# Patient Record
Sex: Female | Born: 1935 | Race: White | Hispanic: No | Smoking: Never smoker
Health system: Southern US, Community
[De-identification: ages and names within clinical notes are randomized; demographics above are authoritative.]

## PROBLEM LIST (undated history)

## (undated) DIAGNOSIS — IMO0002 Reserved for concepts with insufficient information to code with codable children: Secondary | ICD-10-CM

## (undated) DIAGNOSIS — D649 Anemia, unspecified: Secondary | ICD-10-CM

## (undated) DIAGNOSIS — I639 Cerebral infarction, unspecified: Secondary | ICD-10-CM

## (undated) DIAGNOSIS — K219 Gastro-esophageal reflux disease without esophagitis: Secondary | ICD-10-CM

## (undated) DIAGNOSIS — F32A Depression, unspecified: Secondary | ICD-10-CM

## (undated) DIAGNOSIS — J90 Pleural effusion, not elsewhere classified: Secondary | ICD-10-CM

## (undated) DIAGNOSIS — C801 Malignant (primary) neoplasm, unspecified: Secondary | ICD-10-CM

## (undated) DIAGNOSIS — E871 Hypo-osmolality and hyponatremia: Secondary | ICD-10-CM

## (undated) DIAGNOSIS — M1990 Unspecified osteoarthritis, unspecified site: Secondary | ICD-10-CM

## (undated) DIAGNOSIS — M545 Low back pain, unspecified: Secondary | ICD-10-CM

## (undated) DIAGNOSIS — I5030 Unspecified diastolic (congestive) heart failure: Secondary | ICD-10-CM

## (undated) DIAGNOSIS — I4891 Unspecified atrial fibrillation: Secondary | ICD-10-CM

## (undated) DIAGNOSIS — G2581 Restless legs syndrome: Secondary | ICD-10-CM

## (undated) DIAGNOSIS — J449 Chronic obstructive pulmonary disease, unspecified: Secondary | ICD-10-CM

## (undated) DIAGNOSIS — I10 Essential (primary) hypertension: Secondary | ICD-10-CM

## (undated) DIAGNOSIS — F329 Major depressive disorder, single episode, unspecified: Secondary | ICD-10-CM

## (undated) DIAGNOSIS — R0600 Dyspnea, unspecified: Secondary | ICD-10-CM

## (undated) DIAGNOSIS — I509 Heart failure, unspecified: Secondary | ICD-10-CM

## (undated) DIAGNOSIS — I739 Peripheral vascular disease, unspecified: Secondary | ICD-10-CM

## (undated) DIAGNOSIS — F419 Anxiety disorder, unspecified: Secondary | ICD-10-CM

## (undated) DIAGNOSIS — I2510 Atherosclerotic heart disease of native coronary artery without angina pectoris: Secondary | ICD-10-CM

## (undated) HISTORY — DX: Gastro-esophageal reflux disease without esophagitis: K21.9

## (undated) HISTORY — DX: Atherosclerotic heart disease of native coronary artery without angina pectoris: I25.10

## (undated) HISTORY — PX: CYSTECTOMY: SUR359

## (undated) HISTORY — DX: Hypo-osmolality and hyponatremia: E87.1

## (undated) HISTORY — DX: Pleural effusion, not elsewhere classified: J90

## (undated) HISTORY — PX: CARPAL TUNNEL RELEASE: SHX101

## (undated) HISTORY — DX: Essential (primary) hypertension: I10

## (undated) HISTORY — DX: Anxiety disorder, unspecified: F41.9

## (undated) HISTORY — PX: APPENDECTOMY: SHX54

## (undated) HISTORY — DX: Heart failure, unspecified: I50.9

## (undated) HISTORY — DX: Major depressive disorder, single episode, unspecified: F32.9

## (undated) HISTORY — DX: Depression, unspecified: F32.A

## (undated) HISTORY — DX: Unspecified diastolic (congestive) heart failure: I50.30

## (undated) HISTORY — DX: Anemia, unspecified: D64.9

## (undated) HISTORY — DX: Unspecified atrial fibrillation: I48.91

## (undated) HISTORY — DX: Cerebral infarction, unspecified: I63.9

## (undated) HISTORY — DX: Dyspnea, unspecified: R06.00

## (undated) HISTORY — PX: ABDOMINAL HYSTERECTOMY: SHX81

## (undated) HISTORY — DX: Low back pain: M54.5

## (undated) HISTORY — DX: Restless legs syndrome: G25.81

## (undated) HISTORY — DX: Low back pain, unspecified: M54.50

## (undated) HISTORY — PX: CATARACT EXTRACTION: SUR2

## (undated) HISTORY — DX: Chronic obstructive pulmonary disease, unspecified: J44.9

---

## 2006-02-08 ENCOUNTER — Ambulatory Visit: Payer: Self-pay | Admitting: Cardiology

## 2006-02-09 ENCOUNTER — Inpatient Hospital Stay (HOSPITAL_BASED_OUTPATIENT_CLINIC_OR_DEPARTMENT_OTHER): Admission: RE | Admit: 2006-02-09 | Discharge: 2006-02-09 | Payer: Self-pay | Admitting: Internal Medicine

## 2006-02-09 ENCOUNTER — Ambulatory Visit: Payer: Self-pay | Admitting: Internal Medicine

## 2006-02-16 ENCOUNTER — Ambulatory Visit: Payer: Self-pay | Admitting: Cardiology

## 2006-02-23 ENCOUNTER — Ambulatory Visit: Payer: Self-pay | Admitting: Cardiology

## 2010-08-24 DIAGNOSIS — I639 Cerebral infarction, unspecified: Secondary | ICD-10-CM

## 2010-08-24 HISTORY — DX: Cerebral infarction, unspecified: I63.9

## 2011-01-09 NOTE — Cardiovascular Report (Signed)
NAMEPHOENICIA, [name]                    ACCOUNT NO.:  [id number]   MEDICAL RECORD NO.:  [id number]          PATIENT TYPE:  OIB   LOCATION:  NA                           FACILITY:  [facility]   PHYSICIAN:  Arvilla Meres, M.D. LHCDATE OF BIRTH:  February 08, 1936   DATE OF PROCEDURE:  02/09/2006  DATE OF DISCHARGE:                              CARDIAC CATHETERIZATION   PRIMARY CARE PHYSICIAN:  Dr. Reymundo Poll in [place].   CARDIOLOGIST:  Learta Codding, M.D., [facility].   PATIENT IDENTIFICATION:  [name] is a 75 year old woman with a history of  previous tobacco use, as well as hypertension and hyperlipidemia.  She  previously underwent cardiac catheterization in 1998 at [facility] after a false positive nuclear study.  This showed normal  coronary arteries with a normal ejection fraction.  She has recently been  having some exertional chest pain concerning for angina and has been  arranged for catheterization in the outpatient lab.   PROCEDURES PERFORMED:  1.  Selective coronary angiography.  2.  Left heart catheterization.  3.  Left ventriculogram.   DESCRIPTION OF PROCEDURE:  The risks and benefits of catheterization were  explained.  Consent was signed and placed on the chart.  A 4-French arterial  sheath was placed in the right femoral artery.  However, there was some  oozing around the sheath, and we sized up to a 5-French arterial sheath with  good hemostasis.  Standard catheters, including preformed Judkins JL-4, 3-D  RCA, and angled pigtail were used for the procedure.  All catheter exchanges  made over wire.  There were no apparent complications.   Central aortic pressure is 135/69 with a mean of 93.  LV pressure is 152/90  with an LVEDP of 19.  There is no aortic stenosis.   Left main was normal.   LAD was a long vessel coursing to the apex.  It gave off 2 small proximal  diagonals and a large third diagonal, as well as a large septal perforator.  In the proximal  mid portion of the LAD, there was a tubular 30% lesion.  In  the mid-to-distal LAD, just after the takeoff of the third diagonal, there  was a focal 60% to 70% stenosis.  This appeared eccentric.  There was mild-  to-moderate, diffuse disease in the distal LAD which was small.   The left circumflex was a large system made up of a small OM-1 and a large  OM-2.  There is no angiographic CAD.   Right coronary was a large, dominant vessel.  It gave off an RV branch, a  large acute marginal which appeared to serve the PDA area, and three  posterolaterals.  There is no angiographic CAD.   A left ventriculogram done in the RAO position showed an EF of 70% with  evidence of left ventricular hypertrophy.  There was no mitral regurgitation  or wall motion abnormalities.   ASSESSMENT:  1.  Primarily moderate nonobstructive coronary artery disease, primarily      limited to the left anterior descending.  There is a  borderline lesion      in the mid-to-distal left anterior descending coronary artery, at the      bifurcation of the third diagonal.  2.  Normal left ventricular function with left ventricular hypertrophy.  3.  Mildly elevated end-diastolic pressure, consistent with diastolic      dysfunction.   PLAN / DISCUSSION:  I have reviewed the films with Dr. Juanda Chance.  I suspect  the LAD lesion may look somewhat worse just due to the fact that it is at  the bifurcation.  I am not sure if this is clearly hemodynamically  significant.  I think it is reasonable to proceed with a treadmill Myoview  to further evaluate the functional significance of this.  However, she has  had a false positive Myoview in the past, and this lesion could also be  mimicked by shifting breast attenuation.  Thus, if there is any question  after her Myoview, it would be reasonable to perform a flow wire  interrogation of the LAD.  For now, we will continue aggressive risk factor  management.      Arvilla Meres,  M.D. [facility]  Electronically Signed     DB/MEDQ  D:  02/09/2006  T:  02/09/2006  Job:  [id number]   cc:   Dr. Howell Rucks, [place]   Learta Codding, M.D. [facility]  [address]  [address]  [place]  [place] [address]

## 2012-02-09 DIAGNOSIS — K254 Chronic or unspecified gastric ulcer with hemorrhage: Secondary | ICD-10-CM

## 2012-02-09 DIAGNOSIS — R0602 Shortness of breath: Secondary | ICD-10-CM

## 2012-02-10 ENCOUNTER — Encounter: Payer: Self-pay | Admitting: Cardiology

## 2012-02-10 DIAGNOSIS — I2510 Atherosclerotic heart disease of native coronary artery without angina pectoris: Secondary | ICD-10-CM

## 2012-02-12 ENCOUNTER — Encounter: Payer: Self-pay | Admitting: Cardiology

## 2012-02-12 ENCOUNTER — Encounter: Payer: Self-pay | Admitting: *Deleted

## 2012-03-16 ENCOUNTER — Encounter: Payer: Self-pay | Admitting: Cardiology

## 2012-03-17 ENCOUNTER — Ambulatory Visit (INDEPENDENT_AMBULATORY_CARE_PROVIDER_SITE_OTHER): Payer: Medicare Other | Admitting: Adult Health

## 2012-03-17 ENCOUNTER — Encounter: Payer: Self-pay | Admitting: Adult Health

## 2012-03-17 VITALS — BP 140/80 | HR 58 | Ht 64.0 in | Wt 201.0 lb

## 2012-03-17 DIAGNOSIS — I4891 Unspecified atrial fibrillation: Secondary | ICD-10-CM

## 2012-03-17 DIAGNOSIS — K922 Gastrointestinal hemorrhage, unspecified: Secondary | ICD-10-CM | POA: Insufficient documentation

## 2012-03-17 DIAGNOSIS — I2510 Atherosclerotic heart disease of native coronary artery without angina pectoris: Secondary | ICD-10-CM

## 2012-03-17 DIAGNOSIS — I10 Essential (primary) hypertension: Secondary | ICD-10-CM

## 2012-03-17 NOTE — Progress Notes (Signed)
HPI: Mrs. dosed is a 76 year old patient of Dr. Andee Lineman who saw her at [facility] hospital secondary to CHF and new onset atrial fibrillation of unknown duration. She was admitted with acute anemia secondary to bleeding ulcer. She has a history of diastolic dysfunction, chronic pain being followed by Dr. Laurian Brim with steroid injections into her back.   According to consult note the patient had a history of cardiac catheterization in 1998 at [facility] demonstrating normal coronary arteries and normal LV function. She had a second cardiac catheterization in 2007 secondary to chest pain which demonstrated moderate, nonobstructive CAD, primarily limited to the LAD, with a borderline mid/distal LAD lesion, at the bifurcation of the third diagonal branch. She was also found to have elevated LVEDP consistent with diastolic dysfunction.   Chads score during hospitalization was 3 (hypertension and CVA), however Coumadin was not started in the setting of GI bleed. She is being followed by Dr. Teena Dunk, GI specialist in [place]. He has her on iron replacement and PPI, and plans a followup EGD.     She is without complaint today. Pain control is very well managed. She denies racing heart or palpitations. She denies shortness of breath. She denies chest pain. Allergies  Allergen Reactions  . Benadryl (Diphenhydramine) Rash    Current Outpatient Prescriptions  Medication Sig Dispense Refill  . Calcium Carbonate-Vitamin D (CALCIUM-VITAMIN D) 500-200 MG-UNIT per tablet Take 1 tablet by mouth.      . diazepam (VALIUM) 5 MG tablet Take 5 mg by mouth 2 (two) times daily.      Marland Kitchen FLUoxetine (PROZAC) 20 MG tablet Take 20 mg by mouth daily.      Marland Kitchen HYDROcodone-acetaminophen (NORCO) 5-325 MG per tablet Take 1 tablet by mouth every 6 (six) hours as needed.      . metoprolol (LOPRESSOR) 100 MG tablet Take 100 mg by mouth daily.      . polyethylene glycol (MIRALAX / GLYCOLAX) packet Take 17 g by mouth daily.       . potassium chloride SA (K-DUR,KLOR-CON) 20 MEQ tablet Take 20 mEq by mouth 2 (two) times daily.      . pravastatin (PRAVACHOL) 40 MG tablet Take 40 mg by mouth daily.      Marland Kitchen rOPINIRole (REQUIP) 3 MG tablet Take 3 mg by mouth at bedtime.      . temazepam (RESTORIL) 15 MG capsule Take 15 mg by mouth at bedtime as needed.      . triamterene-hydrochlorothiazide (MAXZIDE-25) 37.5-25 MG per tablet Take 1 tablet by mouth daily.        Past Medical History  Diagnosis Date  . Atrial fibrillation   . Diastolic heart failure   . Depression   . Anxiety disorder   . GERD (gastroesophageal reflux disease)   . Low back pain   . Hypertension   . RLS (restless legs syndrome)   . CVA (cerebral infarction)   . CHF (congestive heart failure)   . Anemia   . Dyspnea   . Hyponatremia   . Pleural effusion   . Coronary artery disease   . COPD (chronic obstructive pulmonary disease)     Past Surgical History  Procedure Date  . Carpal tunnel release   . Cystectomy   . Cataract extraction   . Appendectomy     [id number] of systems complete and found to be negative unless listed above  PHYSICAL EXAM BP 140/80  Pulse 58  Ht 5\' 4"  (1.626 m)  Wt 201 lb (  91.173 kg)  BMI 34.50 kg/m2  General: Well developed, well nourished, in no acute distress Head: Eyes PERRLA, No xanthomas.   Normal cephalic and atramatic  Lungs: Clear bilaterally to auscultation and percussion. Heart: HRIR S1 S2, without MRG.  Pulses are 2+ & equal.            No carotid bruit. No JVD.  No abdominal bruits. No femoral bruits. Abdomen: Bowel sounds are positive, abdomen soft and non-tender without masses or                  Hernia's noted. Msk:  Back normal, normal gait. But uses walker for ambulation. Normal strength and tone for age. Extremities: No clubbing, cyanosis or edema.  DP +1 Neuro: Alert and oriented X 3. Psych:  Good affect, responds appropriately  EKG: Coarse atrial fib with rate of 58 bpm.   ASSESSMENT  AND PLAN

## 2012-03-17 NOTE — Assessment & Plan Note (Signed)
Patient's heart rate is well-controlled without symptoms of rapid heart rate or palpitations. She remains on metoprolol 100 mg daily. She is not on Coumadin secondary to GI bleed. She continues to follow with Dr. Teena [name] with planned followup EGD. Her chest port is 3 for hypertension and CVA. She has a moderate risk for thromboembolic CVA, however, will need to have clearance from GI before starting her on Coumadin. She states she has had recent labs per Dr. Teena [name] which were found to be within normal limits. She is on iron replacement therapy as well. We will discuss this further with Dr. Dietrich [name], but will hold off on using Coumadin until ulcer is healed and she has a low likelihood of recurrent GI bleeding. She will be seen by Dr. Andee [name] on followup for more discussion of same.

## 2012-03-17 NOTE — Assessment & Plan Note (Signed)
Moderate control. She has recently been started on Maxzide 37.5/25 mg. Continued labs and management per PCP/Dr. Andee Lineman.

## 2012-03-17 NOTE — Patient Instructions (Addendum)
Your physician wants you to follow-up in: 6 months with Dr. Andee Lineman. You will receive a reminder letter in the mail two months in advance. If you don't receive a letter, please call our office to schedule the follow-up appointment.

## 2012-03-17 NOTE — Assessment & Plan Note (Signed)
She is without complaint of chest pain or any cardiac symptoms. We will continue to follow her. She may need followup stress testing if she becomes symptomatic with known history of LAD disease. Continue beta blocker at this time.

## 2013-02-06 ENCOUNTER — Inpatient Hospital Stay (HOSPITAL_COMMUNITY)
Admission: EM | Admit: 2013-02-06 | Discharge: 2013-02-14 | DRG: 065 | Disposition: A | Discharge status: Skilled Nursing Facility | Payer: Medicare Other | Attending: Diagnostic Neuroimaging | Admitting: Diagnostic Neuroimaging

## 2013-02-06 ENCOUNTER — Inpatient Hospital Stay (HOSPITAL_COMMUNITY): Payer: Medicare Other

## 2013-02-06 ENCOUNTER — Encounter (HOSPITAL_COMMUNITY): Payer: Self-pay

## 2013-02-06 ENCOUNTER — Emergency Department (HOSPITAL_COMMUNITY): Payer: Medicare Other

## 2013-02-06 ENCOUNTER — Other Ambulatory Visit: Payer: Self-pay

## 2013-02-06 DIAGNOSIS — J4489 Other specified chronic obstructive pulmonary disease: Secondary | ICD-10-CM | POA: Diagnosis present

## 2013-02-06 DIAGNOSIS — D696 Thrombocytopenia, unspecified: Secondary | ICD-10-CM | POA: Diagnosis not present

## 2013-02-06 DIAGNOSIS — F329 Major depressive disorder, single episode, unspecified: Secondary | ICD-10-CM | POA: Diagnosis present

## 2013-02-06 DIAGNOSIS — R51 Headache: Secondary | ICD-10-CM | POA: Diagnosis not present

## 2013-02-06 DIAGNOSIS — R519 Headache, unspecified: Secondary | ICD-10-CM | POA: Diagnosis not present

## 2013-02-06 DIAGNOSIS — I10 Essential (primary) hypertension: Secondary | ICD-10-CM

## 2013-02-06 DIAGNOSIS — I4891 Unspecified atrial fibrillation: Secondary | ICD-10-CM

## 2013-02-06 DIAGNOSIS — I5030 Unspecified diastolic (congestive) heart failure: Secondary | ICD-10-CM | POA: Diagnosis present

## 2013-02-06 DIAGNOSIS — J449 Chronic obstructive pulmonary disease, unspecified: Secondary | ICD-10-CM | POA: Diagnosis present

## 2013-02-06 DIAGNOSIS — I619 Nontraumatic intracerebral hemorrhage, unspecified: Secondary | ICD-10-CM | POA: Diagnosis not present

## 2013-02-06 DIAGNOSIS — D72829 Elevated white blood cell count, unspecified: Secondary | ICD-10-CM | POA: Diagnosis present

## 2013-02-06 DIAGNOSIS — E876 Hypokalemia: Secondary | ICD-10-CM | POA: Diagnosis not present

## 2013-02-06 DIAGNOSIS — I615 Nontraumatic intracerebral hemorrhage, intraventricular: Secondary | ICD-10-CM | POA: Diagnosis not present

## 2013-02-06 DIAGNOSIS — G2581 Restless legs syndrome: Secondary | ICD-10-CM | POA: Diagnosis present

## 2013-02-06 DIAGNOSIS — K219 Gastro-esophageal reflux disease without esophagitis: Secondary | ICD-10-CM | POA: Diagnosis present

## 2013-02-06 DIAGNOSIS — Z8673 Personal history of transient ischemic attack (TIA), and cerebral infarction without residual deficits: Secondary | ICD-10-CM

## 2013-02-06 DIAGNOSIS — I509 Heart failure, unspecified: Secondary | ICD-10-CM | POA: Diagnosis present

## 2013-02-06 DIAGNOSIS — E669 Obesity, unspecified: Secondary | ICD-10-CM | POA: Diagnosis present

## 2013-02-06 DIAGNOSIS — F3289 Other specified depressive episodes: Secondary | ICD-10-CM | POA: Diagnosis present

## 2013-02-06 DIAGNOSIS — I63219 Cerebral infarction due to unspecified occlusion or stenosis of unspecified vertebral arteries: Principal | ICD-10-CM | POA: Diagnosis present

## 2013-02-06 DIAGNOSIS — F411 Generalized anxiety disorder: Secondary | ICD-10-CM | POA: Diagnosis present

## 2013-02-06 DIAGNOSIS — Z8711 Personal history of peptic ulcer disease: Secondary | ICD-10-CM

## 2013-02-06 DIAGNOSIS — Z6834 Body mass index (BMI) 34.0-34.9, adult: Secondary | ICD-10-CM

## 2013-02-06 DIAGNOSIS — I2510 Atherosclerotic heart disease of native coronary artery without angina pectoris: Secondary | ICD-10-CM | POA: Diagnosis present

## 2013-02-06 DIAGNOSIS — I63531 Cerebral infarction due to unspecified occlusion or stenosis of right posterior cerebral artery: Secondary | ICD-10-CM | POA: Diagnosis present

## 2013-02-06 HISTORY — DX: Reserved for concepts with insufficient information to code with codable children: IMO0002

## 2013-02-06 HISTORY — DX: Cerebral infarction, unspecified: I63.9

## 2013-02-06 LAB — DIFFERENTIAL
Eosinophils Absolute: 0 10*3/uL (ref 0.0–0.7)
Lymphocytes Relative: 10 % — ABNORMAL LOW (ref 12–46)
Lymphs Abs: 1.4 10*3/uL (ref 0.7–4.0)
Monocytes Relative: 5 % (ref 3–12)
Neutro Abs: 11 10*3/uL — ABNORMAL HIGH (ref 1.7–7.7)
Neutrophils Relative %: 85 % — ABNORMAL HIGH (ref 43–77)

## 2013-02-06 LAB — PROTIME-INR: Prothrombin Time: 13.7 seconds (ref 11.6–15.2)

## 2013-02-06 LAB — POCT I-STAT, CHEM 8
Calcium, Ion: 1.08 mmol/L — ABNORMAL LOW (ref 1.13–1.30)
Hemoglobin: 18 g/dL — ABNORMAL HIGH (ref 12.0–15.0)
Sodium: 137 mEq/L (ref 135–145)
TCO2: 32 mmol/L (ref 0–100)

## 2013-02-06 LAB — COMPREHENSIVE METABOLIC PANEL
Alkaline Phosphatase: 102 U/L (ref 39–117)
BUN: 13 mg/dL (ref 6–23)
CO2: 30 mEq/L (ref 19–32)
Chloride: 94 mEq/L — ABNORMAL LOW (ref 96–112)
GFR calc Af Amer: 66 mL/min — ABNORMAL LOW (ref >90)
Glucose, Bld: 166 mg/dL — ABNORMAL HIGH (ref 70–99)
Potassium: 3.4 mEq/L — ABNORMAL LOW (ref 3.5–5.1)
Total Bilirubin: 0.7 mg/dL (ref 0.3–1.2)

## 2013-02-06 LAB — CBC
Hemoglobin: 16.7 g/dL — ABNORMAL HIGH (ref 12.0–15.0)
MCH: 31.1 pg (ref 26.0–34.0)
MCHC: 35.2 g/dL (ref 30.0–36.0)
WBC: 13 10*3/uL — ABNORMAL HIGH (ref 4.0–10.5)

## 2013-02-06 LAB — MRSA PCR SCREENING: MRSA by PCR: NEGATIVE

## 2013-02-06 MED ORDER — ACETAMINOPHEN 325 MG PO TABS
650.0000 mg | ORAL_TABLET | ORAL | Status: DC | PRN
  Administered 2013-02-07 – 2013-02-11 (×4): 650 mg via ORAL
  Filled 2013-02-06 (×5): qty 2

## 2013-02-06 MED ORDER — ONDANSETRON HCL 4 MG/2ML IJ SOLN
4.0000 mg | Freq: Four times a day (QID) | INTRAMUSCULAR | Status: DC | PRN
  Administered 2013-02-06 – 2013-02-14 (×17): 4 mg via INTRAVENOUS
  Filled 2013-02-06 (×18): qty 2

## 2013-02-06 MED ORDER — LABETALOL HCL 5 MG/ML IV SOLN
INTRAVENOUS | Status: AC
  Administered 2013-02-06: 20 mg
  Filled 2013-02-06: qty 4

## 2013-02-06 MED ORDER — BIOTENE DRY MOUTH MT LIQD
15.0000 mL | Freq: Two times a day (BID) | OROMUCOSAL | Status: DC
  Administered 2013-02-06 – 2013-02-14 (×16): 15 mL via OROMUCOSAL

## 2013-02-06 MED ORDER — METOPROLOL SUCCINATE ER 100 MG PO TB24
100.0000 mg | ORAL_TABLET | Freq: Every day | ORAL | Status: DC
  Filled 2013-02-06: qty 1

## 2013-02-06 MED ORDER — ACETAMINOPHEN 650 MG RE SUPP
650.0000 mg | RECTAL | Status: DC | PRN
  Administered 2013-02-06: 650 mg via RECTAL
  Filled 2013-02-06: qty 1

## 2013-02-06 MED ORDER — PANTOPRAZOLE SODIUM 40 MG IV SOLR
40.0000 mg | Freq: Every day | INTRAVENOUS | Status: DC
  Administered 2013-02-06 – 2013-02-08 (×3): 40 mg via INTRAVENOUS
  Filled 2013-02-06 (×5): qty 40

## 2013-02-06 MED ORDER — HYDROMORPHONE HCL PF 1 MG/ML IJ SOLN
INTRAMUSCULAR | Status: AC
  Filled 2013-02-06: qty 1

## 2013-02-06 MED ORDER — HYDROMORPHONE HCL PF 1 MG/ML IJ SOLN
1.0000 mg | Freq: Once | INTRAMUSCULAR | Status: AC
  Administered 2013-02-06: 1 mg via INTRAVENOUS

## 2013-02-06 MED ORDER — TRAMADOL HCL 50 MG PO TABS
50.0000 mg | ORAL_TABLET | Freq: Four times a day (QID) | ORAL | Status: DC | PRN
  Administered 2013-02-06 – 2013-02-14 (×10): 50 mg via ORAL
  Filled 2013-02-06 (×10): qty 1

## 2013-02-06 MED ORDER — SODIUM CHLORIDE 0.9 % IV SOLN
INTRAVENOUS | Status: DC
  Administered 2013-02-06 – 2013-02-12 (×8): via INTRAVENOUS

## 2013-02-06 MED ORDER — HYDROMORPHONE HCL PF 2 MG/ML IJ SOLN
2.0000 mg | Freq: Once | INTRAMUSCULAR | Status: AC
  Administered 2013-02-06: 2 mg via INTRAVENOUS
  Filled 2013-02-06: qty 1

## 2013-02-06 MED ORDER — NICARDIPINE HCL IN NACL 20-0.86 MG/200ML-% IV SOLN
5.0000 mg/h | Freq: Once | INTRAVENOUS | Status: AC
  Administered 2013-02-06: 5 mg/h via INTRAVENOUS
  Filled 2013-02-06: qty 200

## 2013-02-06 MED ORDER — HYDROMORPHONE HCL PF 1 MG/ML IJ SOLN
1.0000 mg | INTRAMUSCULAR | Status: DC | PRN
  Administered 2013-02-06 – 2013-02-08 (×8): 1 mg via INTRAVENOUS
  Filled 2013-02-06 (×9): qty 1

## 2013-02-06 MED ORDER — NICARDIPINE HCL IN NACL 20-0.86 MG/200ML-% IV SOLN
5.0000 mg/h | INTRAVENOUS | Status: DC
  Administered 2013-02-06: 1 mg/h via INTRAVENOUS
  Administered 2013-02-06: 3 mg/h via INTRAVENOUS
  Administered 2013-02-07 – 2013-02-10 (×11): 5 mg/h via INTRAVENOUS
  Administered 2013-02-10 (×2): 7.5 mg/h via INTRAVENOUS
  Filled 2013-02-06 (×18): qty 200

## 2013-02-06 MED ORDER — LABETALOL HCL 5 MG/ML IV SOLN
10.0000 mg | INTRAVENOUS | Status: DC | PRN
  Administered 2013-02-10: 20 mg via INTRAVENOUS
  Administered 2013-02-11: 40 mg via INTRAVENOUS
  Administered 2013-02-11 (×3): 20 mg via INTRAVENOUS
  Administered 2013-02-11 – 2013-02-12 (×2): 40 mg via INTRAVENOUS
  Administered 2013-02-12: 20 mg via INTRAVENOUS
  Administered 2013-02-12: 40 mg via INTRAVENOUS
  Administered 2013-02-12 – 2013-02-14 (×6): 20 mg via INTRAVENOUS
  Filled 2013-02-06 (×11): qty 4
  Filled 2013-02-06 (×2): qty 8

## 2013-02-06 MED ORDER — SENNOSIDES-DOCUSATE SODIUM 8.6-50 MG PO TABS
1.0000 | ORAL_TABLET | Freq: Two times a day (BID) | ORAL | Status: DC
  Filled 2013-02-06 (×2): qty 1

## 2013-02-06 MED ORDER — TRIAMTERENE-HCTZ 37.5-25 MG PO TABS
1.0000 | ORAL_TABLET | Freq: Every day | ORAL | Status: DC
  Filled 2013-02-06: qty 1

## 2013-02-06 NOTE — ED Notes (Signed)
Checked patient blood sugar it was 147 notified RN of blood sugar 

## 2013-02-06 NOTE — ED Notes (Signed)
Dr. Roseanne Reno made aware of Cardene drip stopped. No further orders received. Also made aware pt passed Swallow Screen

## 2013-02-06 NOTE — ED Provider Notes (Signed)
History     CSN: [id number]  Arrival date & time 02/06/13  1478   First MD Initiated Contact with Patient 02/06/13 0636      No chief complaint on file.   (Consider location/radiation/quality/duration/timing/severity/associated sxs/prior treatment) HPI HX per PT and EMS, at her computer this am had onset of severe HA and left facial numbness.  She has associated N/V, she was awake with sudden onset of symptoms unable to say what time was, states a few hours ago.  She had her husband help her to the floor, denies any trouble with vision or gait.  She felt near syncopal but did not pass out. She has h/o prior stroke that affected her left side.  HA mod to severe.  Past Medical History  Diagnosis Date  . Atrial fibrillation   . Diastolic heart failure   . Depression   . Anxiety disorder   . GERD (gastroesophageal reflux disease)   . Low back pain   . Hypertension   . RLS (restless legs syndrome)   . CVA (cerebral infarction)   . CHF (congestive heart failure)   . Anemia   . Dyspnea   . Hyponatremia   . Pleural effusion   . Coronary artery disease   . COPD (chronic obstructive pulmonary disease)     Past Surgical History  Procedure Laterality Date  . Carpal tunnel release    . Cystectomy    . Cataract extraction    . Appendectomy      No family history on file.  History  Substance Use Topics  . Smoking status: Never Smoker   . Smokeless tobacco: Not on file  . Alcohol Use: Yes    OB History   Grav Para Term Preterm Abortions TAB SAB Ect Mult Living                  Review of Systems  Constitutional: Negative for fever and chills.  HENT: Negative for neck pain and neck stiffness.   Eyes: Negative for pain.  Respiratory: Negative for shortness of breath.   Cardiovascular: Negative for chest pain.  Gastrointestinal: Negative for abdominal pain.  Genitourinary: Negative for dysuria.  Musculoskeletal: Negative for back pain.  Skin: Negative for rash.   Neurological: Positive for numbness and headaches. Negative for seizures.  All other systems reviewed and are negative.    Allergies  Benadryl  Home Medications   Current Outpatient Rx  Name  Route  Sig  Dispense  Refill  . Calcium Carbonate-Vitamin D (CALCIUM-VITAMIN D) 500-200 MG-UNIT per tablet   Oral   Take 1 tablet by mouth.         . diazepam (VALIUM) 5 MG tablet   Oral   Take 5 mg by mouth 2 (two) times daily.         Marland Kitchen FLUoxetine (PROZAC) 20 MG tablet   Oral   Take 20 mg by mouth daily.         Marland Kitchen HYDROcodone-acetaminophen (NORCO) 5-325 MG per tablet   Oral   Take 1 tablet by mouth every 6 (six) hours as needed.         . metoprolol (LOPRESSOR) 100 MG tablet   Oral   Take 100 mg by mouth daily.         . polyethylene glycol (MIRALAX / GLYCOLAX) packet   Oral   Take 17 g by mouth daily.         . potassium chloride SA (K-DUR,KLOR-CON) 20 MEQ tablet  Oral   Take 20 mEq by mouth 2 (two) times daily.         . pravastatin (PRAVACHOL) 40 MG tablet   Oral   Take 40 mg by mouth daily.         Marland Kitchen rOPINIRole (REQUIP) 3 MG tablet   Oral   Take 3 mg by mouth at bedtime.         . temazepam (RESTORIL) 15 MG capsule   Oral   Take 15 mg by mouth at bedtime as needed.         . triamterene-hydrochlorothiazide (MAXZIDE-25) 37.5-25 MG per tablet   Oral   Take 1 tablet by mouth daily.           There were no vitals taken for this visit.  Physical Exam  Nursing note and vitals reviewed. Constitutional: She is oriented to person, place, and time. She appears well-developed and well-nourished.  HENT:  Head: Normocephalic and atraumatic.  Mouth/Throat: Oropharynx is clear and moist. No oropharyngeal exudate.  Eyes: EOM are normal. Pupils are equal, round, and reactive to light.  Neck: Normal range of motion. Neck supple. No tracheal deviation present.  Cardiovascular: Normal rate, normal heart sounds and intact distal pulses.    Pulmonary/Chest: Effort normal and breath sounds normal. No respiratory distress.  Abdominal: Soft. Bowel sounds are normal. She exhibits no distension. There is no tenderness.  Musculoskeletal: Normal range of motion. She exhibits no edema.  Neurological: She is alert and oriented to person, place, and time.  Speech clear, no facial droop, no pronator drift, equal grips/ biceps/ triceps, subj dec sensorium to light touch L face.  Skin: Skin is warm and dry.    ED Course  Procedures (including critical care time)   Results for orders placed during the hospital encounter of 02/06/13  MRSA PCR SCREENING      Result Value Range   MRSA by PCR NEGATIVE  NEGATIVE  CBC      Result Value Range   WBC 13.0 (*) 4.0 - 10.5 K/uL   RBC 5.37 (*) 3.87 - 5.11 MIL/uL   Hemoglobin 16.7 (*) 12.0 - 15.0 g/dL   HCT 09.8 (*) 11.9 - 14.7 %   MCV 88.5  78.0 - 100.0 fL   MCH 31.1  26.0 - 34.0 pg   MCHC 35.2  30.0 - 36.0 g/dL   RDW 82.9  56.2 - 13.0 %   Platelets 131 (*) 150 - 400 K/uL  PROTIME-INR      Result Value Range   Prothrombin Time 13.7  11.6 - 15.2 seconds   INR 1.06  0.00 - 1.49  APTT      Result Value Range   aPTT 21 (*) 24 - 37 seconds  TROPONIN I      Result Value Range   Troponin I <0.30  <0.30 ng/mL  DIFFERENTIAL      Result Value Range   Neutrophils Relative % 85 (*) 43 - 77 %   Neutro Abs 11.0 (*) 1.7 - 7.7 K/uL   Lymphocytes Relative 10 (*) 12 - 46 %   Lymphs Abs 1.4  0.7 - 4.0 K/uL   Monocytes Relative 5  3 - 12 %   Monocytes Absolute 0.6  0.1 - 1.0 K/uL   Eosinophils Relative 0  0 - 5 %   Eosinophils Absolute 0.0  0.0 - 0.7 K/uL   Basophils Relative 0  0 - 1 %   Basophils Absolute 0.0  0.0 - 0.1 K/uL  COMPREHENSIVE METABOLIC PANEL      Result Value Range   Sodium 136  135 - 145 mEq/L   Potassium 3.4 (*) 3.5 - 5.1 mEq/L   Chloride 94 (*) 96 - 112 mEq/L   CO2 30  19 - 32 mEq/L   Glucose, Bld 166 (*) 70 - 99 mg/dL   BUN 13  6 - 23 mg/dL   Creatinine, Ser 1.61  0.50 -  1.10 mg/dL   Calcium 9.7  8.4 - 09.6 mg/dL   Total Protein 8.2  6.0 - 8.3 g/dL   Albumin 4.1  3.5 - 5.2 g/dL   AST 24  0 - 37 U/L   ALT 17  0 - 35 U/L   Alkaline Phosphatase 102  39 - 117 U/L   Total Bilirubin 0.7  0.3 - 1.2 mg/dL   GFR calc non Af Amer 57 (*) >90 mL/min   GFR calc Af Amer 66 (*) >90 mL/min  POCT I-STAT, CHEM 8      Result Value Range   Sodium 137  135 - 145 mEq/L   Potassium 3.3 (*) 3.5 - 5.1 mEq/L   Chloride 96  96 - 112 mEq/L   BUN 13  6 - 23 mg/dL   Creatinine, Ser 0.45  0.50 - 1.10 mg/dL   Glucose, Bld 409 (*) 70 - 99 mg/dL   Calcium, Ion 8.11 (*) 1.13 - 1.30 mmol/L   TCO2 32  0 - 100 mmol/L   Hemoglobin 18.0 (*) 12.0 - 15.0 g/dL   HCT 91.4 (*) 78.2 - 95.6 %   Ct Head Wo Contrast  02/06/2013   *RADIOLOGY REPORT*  Clinical Data: Code stroke.  Left arm weakness.  Tingling and facial droop.  CT HEAD WITHOUT CONTRAST  Technique:  Contiguous axial images were obtained from the base of the skull through the vertex without contrast.  Comparison: No priors.  Findings: The posterior aspect of the right temporal lobe extending into the right occipital lobe there is a large intraparenchymal hemorrhage which measures approximately 7.7 x 2.4 cm.  This is surrounded by some low attenuation in the adjacent parenchyma, compatible with some surrounding vasogenic edema.  There is intraventricular blood within the posterior aspect of the right lateral ventricle.  No blood identified within the left lateral ventricle, third ventricle or fourth ventricle at this time.  No hydrocephalous.  There is some mild local mass effect, but no evidence of frank midline shift.  Mild cerebral and cerebellar atrophy.  Visualized paranasal sinuses and mastoids are well pneumatized.  No acute displaced skull fractures are noted.  IMPRESSION: 1.  Large cerebral hemorrhage centered in the right posterior temporal and occipital regions with surrounding vasogenic edema and a small amount of local mass effect.   There is intraventricular hemorrhage within the right lateral ventricle at this time.  No associated hydrocephalus. 2.  Mild cerebral and cerebellar atrophy.  These results were called by telephone on 02/06/2013 at 06:59 a.m. to Dr. Dierdre Highman, who verbally acknowledged these results.   Original Report Authenticated By: Trudie Reed, M.D.   Mr [facility] Wo Contrast  02/06/2013   *RADIOLOGY REPORT*  Clinical Data:  Headache with visual changes.  Left hemineglect. Left hemianopsia.  Decreased left facial sensation.  Stroke risk factors include hypertension, atrial fibrillation, heart failure, and prior cerebral infarction.  MRI HEAD WITHOUT CONTRAST MRA HEAD WITHOUT CONTRAST  Technique:  Multiplanar, multiecho pulse sequences of the brain and surrounding structures were obtained without intravenous contrast. Angiographic images of  the head were obtained using MRA technique without contrast.  Comparison:  MRI brain 12/02/2009.  CT head earlier today.  MRI HEAD  Findings:  There is a large hemorrhagic infarct involving much of the  posterior cerebral artery territory including the posterior and medial temporal lobe, medial occipital lobe,  and thalamus, all on the right.  Marked T2 shortening correlates with the CT appearance of acute intraparenchymal hemorrhage.  There is intraventricular extension of blood, greater on the right.  There is minimal 1-2 mm right-to-left midline shift.  The patient previously suffered a left PCA territory infarct affecting a similar distribution, which was also hemorrhagic. These changes were less severe than those observed today.  There is resultant atrophy on today's scan of the cortex and subcortical white matter in the left occipital lobe with compensatory enlargement of the left lateral ventricle.  Mild generalized atrophy is present.  Mild chronic microvascular ischemic change affects the periventricular and subcortical white matter.  Calvarium skull base are intact.  Normal  pituitary and cerebellar tonsils.  Upper cervical region unremarkable. Bilateral cataract extraction.  No sinus or mastoid disease. Absent flow void right vertebral artery.  Compared with the CT appearance earlier, when technique differences are considered, the hemorrhage is not clearly worse.  IMPRESSION: Large hemorrhagic right PCA territory infarct.  Intraventricular extension.  No significant midline shift.  Chronic left PCA territory infarct.  Mild atrophy and chronic microvascular ischemic change.  MRA HEAD  Findings: The right internal carotid artery  shows mild irregularity without flow limiting stenosis.  There is a 50% stenosis of the supraclinoid left internal carotid artery.  There is a shallow atheromatous outpouching from the supraclinoid right ICA, consistent with infundibulum, 2 mm in size.  The basilar artery is widely patent.  Right vertebral is occluded distally. This vessel was patent in 2011.  An acute thrombosis of this vessel is not excluded.  Left vertebral is dominant.  Distal left vertebral displays mild irregularity in the proximal V4 segment with a 50-75% stenosis (arrow) near the origin of the left PICA.  Severe disease A1 segment left anterior cerebral artery.  Mild nonstenotic irregularity right proximal A1 ACA.  Mild nonstenotic irregularity M1 segment  middle cerebral arteries. No visible MCA branch occlusion.  Focal occlusion ambient P2 segment right PCA could represent in situ thrombosis, an embolus, or extrinsic mass effect from the hemorrhagic infarct.  Left PCA mildly irregular but otherwise patent.  No definite cerebellar branch occlusion.  IMPRESSION: Focal occlusion ambient P2 segment right PCA of uncertain etiology.  Right vertebral occlusion.  This vessel was patent in 2011; it is unclear if this occurred acutely, contributing to the patient's acute infarction.  50-75% stenosis distal left vertebral.  Intracranial atherosclerotic disease elsewhere as described.   Original  Report Authenticated By: Davonna Belling, M.D.   Mr Brain Wo Contrast  02/06/2013   *RADIOLOGY REPORT*  Clinical Data:  Headache with visual changes.  Left hemineglect. Left hemianopsia.  Decreased left facial sensation.  Stroke risk factors include hypertension, atrial fibrillation, heart failure, and prior cerebral infarction.  MRI HEAD WITHOUT CONTRAST MRA HEAD WITHOUT CONTRAST  Technique:  Multiplanar, multiecho pulse sequences of the brain and surrounding structures were obtained without intravenous contrast. Angiographic images of the head were obtained using MRA technique without contrast.  Comparison:  MRI brain 12/02/2009.  CT head earlier today.  MRI HEAD  Findings:  There is a large hemorrhagic infarct involving much of the  posterior cerebral artery territory including the posterior  and medial temporal lobe, medial occipital lobe,  and thalamus, all on the right.  Marked T2 shortening correlates with the CT appearance of acute intraparenchymal hemorrhage.  There is intraventricular extension of blood, greater on the right.  There is minimal 1-2 mm right-to-left midline shift.  The patient previously suffered a left PCA territory infarct affecting a similar distribution, which was also hemorrhagic. These changes were less severe than those observed today.  There is resultant atrophy on today's scan of the cortex and subcortical white matter in the left occipital lobe with compensatory enlargement of the left lateral ventricle.  Mild generalized atrophy is present.  Mild chronic microvascular ischemic change affects the periventricular and subcortical white matter.  Calvarium skull base are intact.  Normal pituitary and cerebellar tonsils.  Upper cervical region unremarkable. Bilateral cataract extraction.  No sinus or mastoid disease. Absent flow void right vertebral artery.  Compared with the CT appearance earlier, when technique differences are considered, the hemorrhage is not clearly worse.   IMPRESSION: Large hemorrhagic right PCA territory infarct.  Intraventricular extension.  No significant midline shift.  Chronic left PCA territory infarct.  Mild atrophy and chronic microvascular ischemic change.  MRA HEAD  Findings: The right internal carotid artery  shows mild irregularity without flow limiting stenosis.  There is a 50% stenosis of the supraclinoid left internal carotid artery.  There is a shallow atheromatous outpouching from the supraclinoid right ICA, consistent with infundibulum, 2 mm in size.  The basilar artery is widely patent.  Right vertebral is occluded distally. This vessel was patent in 2011.  An acute thrombosis of this vessel is not excluded.  Left vertebral is dominant.  Distal left vertebral displays mild irregularity in the proximal V4 segment with a 50-75% stenosis (arrow) near the origin of the left PICA.  Severe disease A1 segment left anterior cerebral artery.  Mild nonstenotic irregularity right proximal A1 ACA.  Mild nonstenotic irregularity M1 segment  middle cerebral arteries. No visible MCA branch occlusion.  Focal occlusion ambient P2 segment right PCA could represent in situ thrombosis, an embolus, or extrinsic mass effect from the hemorrhagic infarct.  Left PCA mildly irregular but otherwise patent.  No definite cerebellar branch occlusion.  IMPRESSION: Focal occlusion ambient P2 segment right PCA of uncertain etiology.  Right vertebral occlusion.  This vessel was patent in 2011; it is unclear if this occurred acutely, contributing to the patient's acute infarction.  50-75% stenosis distal left vertebral.  Intracranial atherosclerotic disease elsewhere as described.   Original Report Authenticated By: Davonna Belling, M.D.       Date: 02/06/2013  Rate: 71  Rhythm: atrial fibrillation  QRS Axis: normal  Intervals: normal  ST/T Wave abnormalities: nonspecific ST changes  Conduction Disutrbances:none  Narrative Interpretation:   Old EKG Reviewed: none  available  CRITICAL CARE Performed by: Marguerite Barba Total critical care time: 40 Critical care time was exclusive of separately billable procedures and treating other patients. Critical care was necessary to treat or prevent imminent or life-threatening deterioration. Critical care was time spent personally by me on the following activities: development of treatment plan with patient and/or surrogate as well as nursing, discussions with consultants, evaluation of patient's response to treatment, examination of patient, obtaining history from patient or surrogate, ordering and performing treatments and interventions, ordering and review of laboratory studies, ordering and review of radiographic studies, pulse oximetry and re-evaluation of patient's condition.  Code Stroke called on arrival - stat CT brain shows ICH - DR Amada Jupiter bedside, IV  Cardene drip started for HTN and plan NEU admit  .6:59 AM d/w NSG - no surgical intervention at this time. Plan NEU admit. No change repeat exam.    MDM  Code Stroke ICH Cardene drip NSG consult - not surgical Serial neuro exams NEU admit        Sunnie Nielsen, MD 02/06/13 2330

## 2013-02-06 NOTE — Progress Notes (Signed)
Utilization  Review completed.    Chala Gul,RN,BSN   Care Management.    

## 2013-02-06 NOTE — ED Notes (Signed)
Pt returned from MRI. Will be going upstairs to 3100

## 2013-02-06 NOTE — ED Notes (Signed)
Dr. Kirkpatrick at the bedside.  

## 2013-02-06 NOTE — H&P (Signed)
Neurology History and Physical  CC: Numbness  History is obtained from:Patient  HPI: Tracey [name] is a 77 y.o. female who was in her normal state of health until 9pm last night. At that time, she noticed vision changes and headache. This persisted and then this morning, she complained of some numbness and therefore her husband called 911.    LKW: 9pm 06/15 tpa given: no, hemorrhage NIHSS: 6   Past Medical History  Diagnosis Date  . Atrial fibrillation   . Diastolic heart failure   . Depression   . Anxiety disorder   . GERD (gastroesophageal reflux disease)   . Low back pain   . Hypertension   . RLS (restless legs syndrome)   . CVA (cerebral infarction)   . CHF (congestive heart failure)   . Anemia   . Dyspnea   . Hyponatremia   . Pleural effusion   . Coronary artery disease   . COPD (chronic obstructive pulmonary disease)    Social History: Tob: denies smoking and drinking.   Exam: Current vital signs: SpO2 96% Vital signs in last 24 hours: SpO2:  [96 %] 96 % (06/16 0652)  General: in bed, NAD CV: irregular Mental Status: Patient is awake, alert, oriented to person, place, year, but not month Patient is able to give a clear and coherent history. She has no sign of aphasia. She does have a left hemi neglect.  Cranial Nerves: II: Visual Fields are notable for a left hemianopia. Pupils are equal, round, and reactive to light.   III,IV, VI: She has a right gaze preference, but is able to cross midline voluntarily to the left,  V: Facial sensation is decreased on left to pin.  VII: Facial movement is symmetric.  VIII: hearing is intact to voice X: Uvula elevates symmetrically XI: Shoulder shrug is symmetric. XII: tongue is midline without atrophy or fasciculations.  Motor: Tone is normal. Bulk is normal. 5/5 strength was present in all four extremities.  Sensory: Sensation is decreased on left to pin.  Deep Tendon Reflexes: 2+ and symmetric in the biceps and  patellae.  Cerebellar: FNF without clear ataxia Gait: Not assessed due to acute nature of evaluation and multiple medical monitors in ED setting.   I have reviewed labs in epic and the results pertinent to this consultation are: INR normal CBC - mild leukocytosis and mildly low platelets  I have reviewed the images obtained:CT head - right parietal lobar hemorrhage.   Impression: 77 yo F with afib not on anticoagulation with IPH and symptoms starting last night. I suspect that this could be a hemorrhagic infarct. The bleed does have some areas that appear heterogenous and therefore will repeat head CT in 6 hours. Also possible would be amyloid, avm.   Recommendations: 1) BP control < 160/90 2) No anticoagulants or antiplatelets.  3) MRI brain, MRA head to assess for etiology.  4) Repeat CT this afternoon at 1pm unless MRI can be done at about that time 5) SCDs for prophy 6) continue home BP meds, but with current hypertension, have started IV nicardipine drip.  7) admit to ICU  This patient is critically ill and at significant risk of neurological worsening, death and care requires constant monitoring of vital signs, hemodynamics,respiratory and cardiac monitoring, neurological assessment, discussion with family, other specialists and medical decision making of high complexity. I spent 50 minutes of neurocritical care time in the care of  this patient.  Ritta Slot, MD [facility] [phone]  If  7pm- 7am, please page neurology on call at [phone].  02/06/2013  7:36 AM

## 2013-02-06 NOTE — Code Documentation (Signed)
Patient started having headache and tingling at 2030 yesterday evening. Patient's headache worsened and started tingling at 0430. Code stroke called at [phone], patient arrived at 54, LKW at 2030, EDP exam at [phone], stroke team arrived at 0636, neurologist arrived [phone], patient arrived in CT at 51, phlebotomist arrived at [phone], CT read at 40. NIH 06

## 2013-02-06 NOTE — ED Notes (Signed)
Pt states her head is starting to ease off

## 2013-02-06 NOTE — ED Notes (Signed)
CT for 02/06/13 clicked off but scheduled for 1300

## 2013-02-06 NOTE — ED Notes (Signed)
Pt going to 3100 at this time. Alert and oriented to situation, person and situation

## 2013-02-06 NOTE — ED Notes (Addendum)
Per [facility], the patient's husband said that the patient was last seen normal at 2030 last night.  At 2100, she c/o a severe headache.  He also noted she had left-sided facial droop and difficulty moving her left arm.  The patient's husband called [facility] this morning.  On arrival the patient was confused and incontinent of urine.

## 2013-02-07 DIAGNOSIS — I4891 Unspecified atrial fibrillation: Secondary | ICD-10-CM

## 2013-02-07 DIAGNOSIS — I059 Rheumatic mitral valve disease, unspecified: Secondary | ICD-10-CM

## 2013-02-07 DIAGNOSIS — I6350 Cerebral infarction due to unspecified occlusion or stenosis of unspecified cerebral artery: Secondary | ICD-10-CM

## 2013-02-07 DIAGNOSIS — I619 Nontraumatic intracerebral hemorrhage, unspecified: Secondary | ICD-10-CM

## 2013-02-07 MED ORDER — LORAZEPAM 2 MG/ML IJ SOLN
0.5000 mg | Freq: Once | INTRAMUSCULAR | Status: AC
  Administered 2013-02-07: 0.5 mg via INTRAVENOUS

## 2013-02-07 MED ORDER — LORAZEPAM 2 MG/ML IJ SOLN
INTRAMUSCULAR | Status: AC
  Filled 2013-02-07: qty 1

## 2013-02-07 NOTE — Progress Notes (Signed)
VASCULAR LAB PRELIMINARY  PRELIMINARY  PRELIMINARY  PRELIMINARY  Carotid duplex  completed.    Preliminary report:  Bilateral:  Less than 39% ICA stenosis.  Vertebral artery flow is antegrade.      Tracey [name], RVT 02/07/2013, 11:38 AM

## 2013-02-07 NOTE — Progress Notes (Signed)
Physical Therapy Evaluation Patient Details Name: Tracey [name] MRN: [id number] DOB: [birth year] Today's Date: 02/07/2013 Time: 1308-6578 PT Time Calculation (min): 29 min  PT Assessment / Plan / Recommendation Clinical Impression  Pt s/p R PCA CVA with decr mobility secondary to poor endurance, poor balance and visual disturbance.  Will benefit from PT to address endurance and balance.  Recommend NHP for therapy.    PT Assessment  Patient needs continued PT services    Follow Up Recommendations  SNF;Supervision/Assistance - 24 hour                Equipment Recommendations  Other (comment) (TBA)         Frequency Min 3X/week    Precautions / Restrictions Precautions Precautions: Fall Restrictions Weight Bearing Restrictions: No   Pertinent Vitals/Pain 87-99 bpm in afib, 98 % O2 on 4L, 138/57, sitting 153/118, lying down after treatment 132/75.  HA pain in which she was medicated.        Mobility  Bed Mobility Bed Mobility: Rolling Right;Right Sidelying to Sit Rolling Right: 3: Mod assist Right Sidelying to Sit: 3: Mod assist;HOB elevated Details for Bed Mobility Assistance: cues and assist needed for technique and sequencing.  Sat EOB a few minutes and became shaky, c/o dizziness and nausea.  Assisted pt back to supine and got cool washcloth.  Nausea better once PT positioned in bed.  Will reattempt OOB tomorrow.   Transfers Transfers: Not assessed Ambulation/Gait Ambulation/Gait Assistance: Not tested (comment) Stairs: No Wheelchair Mobility Wheelchair Mobility: No Modified Rankin (Stroke Patients Only) Pre-Morbid Rankin Score: Moderate disability Modified Rankin: Moderately severe disability    Exercises General Exercises - Lower Extremity Long Arc Quad: AROM;Both;15 reps;Supine   PT Diagnosis: Generalized weakness  PT Problem List: Decreased activity tolerance;Decreased balance;Decreased mobility;Decreased knowledge of use of DME;Decreased safety  awareness;Decreased cognition;Decreased knowledge of precautions PT Treatment Interventions: DME instruction;Functional mobility training;Therapeutic activities;Therapeutic exercise;Balance training;Patient/family education;Gait training;Stair training   PT Goals Acute Rehab PT Goals PT Goal Formulation: With patient Time For Goal Achievement: 02/21/13 Potential to Achieve Goals: Good Pt will go Supine/Side to Sit: with supervision PT Goal: Supine/Side to Sit - Progress: Goal set today Pt will go Sit to Stand: with supervision;with upper extremity assist PT Goal: Sit to Stand - Progress: Goal set today Pt will Ambulate: 51 - 150 feet;with supervision;with least restrictive assistive device PT Goal: Ambulate - Progress: Goal set today Pt will Go Up / Down Stairs: 1-2 stairs;with least restrictive assistive device;with min assist PT Goal: Up/Down Stairs - Progress: Goal set today  Visit Information  Last PT Received On: 02/07/13 Assistance Needed: +2    Subjective Data  Subjective: "I feel so weak." Patient Stated Goal: to go home   Prior Functioning  Home Living Lives With: Significant other Available Help at Discharge: Family Type of Home: House Home Access: Stairs to enter Secretary/administrator of Steps: 2 Entrance Stairs-Rails: None Home Layout: One level Bathroom Shower/Tub: Engineer, manufacturing systems: Handicapped height Home Adaptive Equipment: Straight cane;Walker - rolling;Wheelchair - manual;Other (comment) (lift chair) Additional Comments: Pt cooked but boyfriend prpared meds and got groceries.  Prior Function Level of Independence: Independent with assistive device(s) (used RW in home) Able to Take Stairs?: Yes Driving: No Vocation: Retired Musician: No difficulties    Copywriter, advertising Arousal/Alertness: Lethargic Behavior During Therapy: Flat affect Overall Cognitive Status: Impaired/Different from baseline Area of Impairment:  Orientation;Memory;Following commands;Safety/judgement;Awareness;Problem solving Orientation Level: Disoriented to;Situation;Time Following Commands: Follows one step commands consistently Safety/Judgement: Decreased  awareness of deficits;Decreased awareness of safety Problem Solving: Difficulty sequencing;Requires verbal cues    Extremity/Trunk Assessment Right Lower Extremity Assessment RLE ROM/Strength/Tone: WFL for tasks assessed Left Lower Extremity Assessment LLE ROM/Strength/Tone: WFL for tasks assessed   Balance Balance Balance Assessed: Yes Static Sitting Balance Static Sitting - Balance Support: Bilateral upper extremity supported;Feet supported Static Sitting - Level of Assistance: 4: Min assist Static Sitting - Comment/# of Minutes: Sat EOB for 5 minutes with min assist.  Became shaky therefore assisted pt to supine.    End of Session PT - End of Session Equipment Utilized During Treatment: Gait belt;Oxygen Activity Tolerance: Patient limited by fatigue Patient left: in bed;with call bell/phone within reach;with family/visitor present Nurse Communication: Mobility status;Need for lift equipment       INGOLD,Rashaud Ybarbo 02/07/2013, 10:23 AM [facility] [phone] (pager)

## 2013-02-07 NOTE — Progress Notes (Signed)
Stroke Team Progress Note  HISTORY  Tracey [name] is a 77 y.o. female who was in her normal state of health until 9pm last night. At that time, she noticed vision changes and headache. This persisted and then this morning, she complained of some numbness and therefore her husband called 911.   Patient was not a TPA candidate secondary to ICH. She was admitted to the neuro ICU  3100 for further evaluation and treatment.  SUBJECTIVE No family is at bedside. The patient complains of a headache and nausea and vomiting. The left face and arm numbness has improved.  OBJECTIVE Most recent Vital Signs: Filed Vitals:   02/07/13 0100 02/07/13 0200 02/07/13 0300 02/07/13 0400  BP: 142/83 142/71 138/63 141/68  Pulse: 83 86 93 97  Temp:    99.5 F (37.5 C)  TempSrc:    Oral  Resp: 11 11 18 19   Height:      Weight:      SpO2: 98% 97% 97% 96%   CBG (last 3)  No results found for this basename: GLUCAP,  in the last 72 hours  IV Fluid Intake:   . sodium chloride 100 mL/hr at 02/07/13 0321  . niCARDipine 5 mg/hr (02/07/13 0600)    MEDICATIONS  . antiseptic oral rinse  15 mL Mouth Rinse BID  . pantoprazole (PROTONIX) IV  40 mg Intravenous QHS   PRN:  acetaminophen, acetaminophen, HYDROmorphone (DILAUDID) injection, labetalol, ondansetron (ZOFRAN) IV, traMADol  Diet:  NPO  Activity:  Bedrest DVT Prophylaxis:  SCD  CLINICALLY SIGNIFICANT STUDIES Basic Metabolic Panel:  Recent Labs Lab 02/06/13 0637 02/06/13 0706  NA 136 137  K 3.4* 3.3*  CL 94* 96  CO2 30  --   GLUCOSE 166* 163*  BUN 13 13  CREATININE 0.96 1.10  CALCIUM 9.7  --    Liver Function Tests:  Recent Labs Lab 02/06/13 0637  AST 24  ALT 17  ALKPHOS 102  BILITOT 0.7  PROT 8.2  ALBUMIN 4.1   CBC:  Recent Labs Lab 02/06/13 0637 02/06/13 0706  WBC 13.0*  --   NEUTROABS 11.0*  --   HGB 16.7* 18.0*  HCT 47.5* 53.0*  MCV 88.5  --   PLT 131*  --    Coagulation:  Recent Labs Lab 02/06/13 0637  LABPROT  13.7  INR 1.06   Cardiac Enzymes:  Recent Labs Lab 02/06/13 0637  TROPONINI <0.30   Urinalysis: No results found for this basename: COLORURINE, APPERANCEUR, LABSPEC, PHURINE, GLUCOSEU, HGBUR, BILIRUBINUR, KETONESUR, PROTEINUR, UROBILINOGEN, NITRITE, LEUKOCYTESUR,  in the last 168 hours Lipid Panel No results found for this basename: chol, trig, hdl, cholhdl, vldl, ldlcalc   HgbA1C  No results found for this basename: HGBA1C    Urine Drug Screen:   No results found for this basename: labopia, cocainscrnur, labbenz, amphetmu, thcu, labbarb    Alcohol Level: No results found for this basename: ETH,  in the last 168 hours  Ct Head Wo Contrast  02/06/2013   *RADIOLOGY REPORT*  Clinical Data: Code stroke.  Left arm weakness.  Tingling and facial droop.  CT HEAD WITHOUT CONTRAST  Technique:  Contiguous axial images were obtained from the base of the skull through the vertex without contrast.  Comparison: No priors.  Findings: The posterior aspect of the right temporal lobe extending into the right occipital lobe there is a large intraparenchymal hemorrhage which measures approximately 7.7 x 2.4 cm.  This is surrounded by some low attenuation in the adjacent parenchyma, compatible  with some surrounding vasogenic edema.  There is intraventricular blood within the posterior aspect of the right lateral ventricle.  No blood identified within the left lateral ventricle, third ventricle or fourth ventricle at this time.  No hydrocephalous.  There is some mild local mass effect, but no evidence of frank midline shift.  Mild cerebral and cerebellar atrophy.  Visualized paranasal sinuses and mastoids are well pneumatized.  No acute displaced skull fractures are noted.  IMPRESSION: 1.  Large cerebral hemorrhage centered in the right posterior temporal and occipital regions with surrounding vasogenic edema and a small amount of local mass effect.  There is intraventricular hemorrhage within the right lateral  ventricle at this time.  No associated hydrocephalus. 2.  Mild cerebral and cerebellar atrophy.  These results were called by telephone on 02/06/2013 at 06:59 a.m. to Dr. Dierdre Highman, who verbally acknowledged these results.   Original Report Authenticated By: Trudie Reed, M.D.   Mr [facility] Wo Contrast  02/06/2013   *RADIOLOGY REPORT*  Clinical Data:  Headache with visual changes.  Left hemineglect. Left hemianopsia.  Decreased left facial sensation.  Stroke risk factors include hypertension, atrial fibrillation, heart failure, and prior cerebral infarction.  MRI HEAD WITHOUT CONTRAST MRA HEAD WITHOUT CONTRAST  Technique:  Multiplanar, multiecho pulse sequences of the brain and surrounding structures were obtained without intravenous contrast. Angiographic images of the head were obtained using MRA technique without contrast.  Comparison:  MRI brain 12/02/2009.  CT head earlier today.  MRI HEAD  Findings:  There is a large hemorrhagic infarct involving much of the  posterior cerebral artery territory including the posterior and medial temporal lobe, medial occipital lobe,  and thalamus, all on the right.  Marked T2 shortening correlates with the CT appearance of acute intraparenchymal hemorrhage.  There is intraventricular extension of blood, greater on the right.  There is minimal 1-2 mm right-to-left midline shift.  The patient previously suffered a left PCA territory infarct affecting a similar distribution, which was also hemorrhagic. These changes were less severe than those observed today.  There is resultant atrophy on today's scan of the cortex and subcortical white matter in the left occipital lobe with compensatory enlargement of the left lateral ventricle.  Mild generalized atrophy is present.  Mild chronic microvascular ischemic change affects the periventricular and subcortical white matter.  Calvarium skull base are intact.  Normal pituitary and cerebellar tonsils.  Upper cervical region  unremarkable. Bilateral cataract extraction.  No sinus or mastoid disease. Absent flow void right vertebral artery.  Compared with the CT appearance earlier, when technique differences are considered, the hemorrhage is not clearly worse.  IMPRESSION: Large hemorrhagic right PCA territory infarct.  Intraventricular extension.  No significant midline shift.  Chronic left PCA territory infarct.  Mild atrophy and chronic microvascular ischemic change.  MRA HEAD  Findings: The right internal carotid artery  shows mild irregularity without flow limiting stenosis.  There is a 50% stenosis of the supraclinoid left internal carotid artery.  There is a shallow atheromatous outpouching from the supraclinoid right ICA, consistent with infundibulum, 2 mm in size.  The basilar artery is widely patent.  Right vertebral is occluded distally. This vessel was patent in 2011.  An acute thrombosis of this vessel is not excluded.  Left vertebral is dominant.  Distal left vertebral displays mild irregularity in the proximal V4 segment with a 50-75% stenosis (arrow) near the origin of the left PICA.  Severe disease A1 segment left anterior cerebral artery.  Mild nonstenotic  irregularity right proximal A1 ACA.  Mild nonstenotic irregularity M1 segment  middle cerebral arteries. No visible MCA branch occlusion.  Focal occlusion ambient P2 segment right PCA could represent in situ thrombosis, an embolus, or extrinsic mass effect from the hemorrhagic infarct.  Left PCA mildly irregular but otherwise patent.  No definite cerebellar branch occlusion.  IMPRESSION: Focal occlusion ambient P2 segment right PCA of uncertain etiology.  Right vertebral occlusion.  This vessel was patent in 2011; it is unclear if this occurred acutely, contributing to the patient's acute infarction.  50-75% stenosis distal left vertebral.  Intracranial atherosclerotic disease elsewhere as described.   Original Report Authenticated By: Davonna Belling, M.D.   Mr Brain Wo  Contrast  02/06/2013   *RADIOLOGY REPORT*  Clinical Data:  Headache with visual changes.  Left hemineglect. Left hemianopsia.  Decreased left facial sensation.  Stroke risk factors include hypertension, atrial fibrillation, heart failure, and prior cerebral infarction.  MRI HEAD WITHOUT CONTRAST MRA HEAD WITHOUT CONTRAST  Technique:  Multiplanar, multiecho pulse sequences of the brain and surrounding structures were obtained without intravenous contrast. Angiographic images of the head were obtained using MRA technique without contrast.  Comparison:  MRI brain 12/02/2009.  CT head earlier today.  MRI HEAD  Findings:  There is a large hemorrhagic infarct involving much of the  posterior cerebral artery territory including the posterior and medial temporal lobe, medial occipital lobe,  and thalamus, all on the right.  Marked T2 shortening correlates with the CT appearance of acute intraparenchymal hemorrhage.  There is intraventricular extension of blood, greater on the right.  There is minimal 1-2 mm right-to-left midline shift.  The patient previously suffered a left PCA territory infarct affecting a similar distribution, which was also hemorrhagic. These changes were less severe than those observed today.  There is resultant atrophy on today's scan of the cortex and subcortical white matter in the left occipital lobe with compensatory enlargement of the left lateral ventricle.  Mild generalized atrophy is present.  Mild chronic microvascular ischemic change affects the periventricular and subcortical white matter.  Calvarium skull base are intact.  Normal pituitary and cerebellar tonsils.  Upper cervical region unremarkable. Bilateral cataract extraction.  No sinus or mastoid disease. Absent flow void right vertebral artery.  Compared with the CT appearance earlier, when technique differences are considered, the hemorrhage is not clearly worse.  IMPRESSION: Large hemorrhagic right PCA territory infarct.   Intraventricular extension.  No significant midline shift.  Chronic left PCA territory infarct.  Mild atrophy and chronic microvascular ischemic change.  MRA HEAD  Findings: The right internal carotid artery  shows mild irregularity without flow limiting stenosis.  There is a 50% stenosis of the supraclinoid left internal carotid artery.  There is a shallow atheromatous outpouching from the supraclinoid right ICA, consistent with infundibulum, 2 mm in size.  The basilar artery is widely patent.  Right vertebral is occluded distally. This vessel was patent in 2011.  An acute thrombosis of this vessel is not excluded.  Left vertebral is dominant.  Distal left vertebral displays mild irregularity in the proximal V4 segment with a 50-75% stenosis (arrow) near the origin of the left PICA.  Severe disease A1 segment left anterior cerebral artery.  Mild nonstenotic irregularity right proximal A1 ACA.  Mild nonstenotic irregularity M1 segment  middle cerebral arteries. No visible MCA branch occlusion.  Focal occlusion ambient P2 segment right PCA could represent in situ thrombosis, an embolus, or extrinsic mass effect from the hemorrhagic infarct.  Left PCA mildly irregular but otherwise patent.  No definite cerebellar branch occlusion.  IMPRESSION: Focal occlusion ambient P2 segment right PCA of uncertain etiology.  Right vertebral occlusion.  This vessel was patent in 2011; it is unclear if this occurred acutely, contributing to the patient's acute infarction.  50-75% stenosis distal left vertebral.  Intracranial atherosclerotic disease elsewhere as described.   Original Report Authenticated By: Davonna Belling, M.D.    CT of the brain    IMPRESSION:  1. Large cerebral hemorrhage centered in the right posterior  temporal and occipital regions with surrounding vasogenic edema and  a small amount of local mass effect. There is intraventricular  hemorrhage within the right lateral ventricle at this time. No  associated  hydrocephalus.  2. Mild cerebral and cerebellar atrophy  MRI of the brain   IMPRESSION:  Large hemorrhagic right PCA territory infarct. Intraventricular  extension. No significant midline shift.  Chronic left PCA territory infarct. Mild atrophy and chronic  microvascular ischemic change.  MRA of the brain   IMPRESSION:  Focal occlusion ambient P2 segment right PCA of uncertain etiology.  Right vertebral occlusion. This vessel was patent in 2011; it is  unclear if this occurred acutely, contributing to the patient's  acute infarction.  50-75% stenosis distal left vertebral.  Intracranial atherosclerotic disease elsewhere as described.  2D Echocardiogram    Carotid Doppler    CXR    EKG   ATRIAL FIBRILLATION, V-RATE 58- 83 ~ var'd rate, irreg atrial activity PROBABLE LVH WITH SECONDARY REPOL ABNRM ~ R56L/RISIII/S12R56/S3RL & rep abn  Therapy Recommendations pending  Physical Exam  General: The patient is alert and cooperative at the time of the examination. The patient is moderately obese.  Respiratory: Lung fields are clear  Abdomen: Abdomen is moderately obese, positive bowel sounds, no tenderness  Skin: No significant peripheral edema is noted.   Neurologic Exam  Cranial nerves: Facial symmetry is present. Speech is normal, no aphasia or dysarthria is noted. Extraocular movements are full, but the patient does have some difficulty with conjugate gaze to the left. Visual fields are notable for a dense left homonymous hemianopsia.  Motor: The patient has good strength in all 4 extremities.  Coordination: The patient has good finger-nose-finger and heel-to-shin bilaterally, with exception that the patient is unable to localize objects spatially with the left arm.  Gait and station: The gait was not tested. No drift is seen.  Reflexes: Deep tendon reflexes are symmetric.    ASSESSMENT Ms. Ritha Sampedro is a 77 y.o. female presenting with right posterior cerebral  artery distribution stroke with subsequent hemorrhage. The patient has a history of atrial fibrillation, she has not been on any antiplatelet medications or anticoagulant medications for over one year secondary to an upper GI bleed from peptic ulcer disease that required blood transfusions. 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 The patient likely had an embolic stroke involving the right posterior cerebral artery, with subsequent hemorrhage.   Atrial fibrillation  History of peptic ulcer disease, upper GI bleed  Depression and anxiety  Gastroesophageal reflux disease  Hypertension  Congestive heart failure  Coronary artery disease  COPD  Obesity  Restless leg syndrome  Hospital day # 1  TREATMENT/PLAN  No anti-coagulation or antiplatelet medications for now  Supportive care, follow CT evaluation of the brain  2-D echocardiogram  Carotid Doppler study  Physical, occupational,  and speech therapy  N.p.o. for now, the patient has nausea and vomiting  Follow clinical course  CT of the head in a.m.   02/07/2013 7:19 AM

## 2013-02-07 NOTE — Progress Notes (Signed)
  Echocardiogram 2D Echocardiogram has been performed.  Georgian Co 02/07/2013, 4:17 PM

## 2013-02-07 NOTE — Evaluation (Signed)
Speech Language Pathology Evaluation Patient Details Name: Tracey [name] MRN: [id number] DOB: 1935-12-05 Today's Date: 02/07/2013 Time: 4098-1191 SLP Time Calculation (min): 20 min  Problem List:  Patient Active Problem List   Diagnosis Date Noted  . Atrial fibrillation 03/17/2012  . CAD in native artery 03/17/2012  . Upper GI bleed 03/17/2012  . Hypertension 03/17/2012   Past Medical History:  Past Medical History  Diagnosis Date  . Atrial fibrillation   . Diastolic heart failure   . Depression   . Anxiety disorder   . GERD (gastroesophageal reflux disease)   . Low back pain   . Hypertension   . RLS (restless legs syndrome)   . CVA (cerebral infarction)   . CHF (congestive heart failure)   . Anemia   . Dyspnea   . Hyponatremia   . Pleural effusion   . Coronary artery disease   . COPD (chronic obstructive pulmonary disease)   . Stroke 2012    left sided deficits  . Bulging discs     receives epidurals for pain control   Past Surgical History:  Past Surgical History  Procedure Laterality Date  . Carpal tunnel release    . Cystectomy    . Cataract extraction    . Appendectomy    . Abdominal hysterectomy     HPI:  [name] is a 77 y.o. female presenting with right posterior cerebral artery distribution stroke with subsequent hemorrhage. The patient has a history of atrial fibrillation, she has not been on any antiplatelet medications or anticoagulant medications for over one year secondary to an upper GI bleed from peptic ulcer disease that required blood transfusions. 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 The patient likely had an embolic stroke involving the right posterior cerebral artery, with subsequent hemorrhage.   Assessment / Plan / Recommendation Clinical Impression  Pts function impaired by pain and lethargy,  decreased sustained attention to complex verbal tasks. Functional ability not tested yet. Speech and language WNL. Pt would benefit from f/u visit for diagnostic therapy when pain and arousal improved to determine if f/u therapy needed. Pt and partner agree.     SLP Assessment  Patient needs continued Speech Lanaguage Pathology Services    Follow Up Recommendations  Outpatient SLP    Frequency and Duration min 2x/week  1 week   Pertinent Vitals/Pain NA   SLP Goals  SLP Goals Potential to Achieve Goals: Good SLP Goal #1: Pt will complete complex verbal and functional tasks with supervision cues for diagnostic treatment.   SLP Evaluation Prior Functioning  Cognitive/Linguistic Baseline: Within functional limits Lives With: Significant other Available Help at Discharge: Family Vocation: Retired   IT consultant  Overall Cognitive Status: Impaired/Different from baseline Arousal/Alertness: Lethargic Orientation Level: Oriented to person;Oriented to place;Disoriented to situation Attention: Focused;Sustained Focused Attention: Appears intact Sustained Attention: Impaired Sustained Attention Impairment: Verbal complex;Functional complex Memory:  (NT due to lethargy, pain) Awareness: Appears intact Problem Solving:  (functional NT, verbal ok) Safety/Judgment: Appears intact    Comprehension  Auditory Comprehension Overall Auditory Comprehension: Appears within functional limits for tasks assessed Yes/No Questions: Within Functional Limits Commands: Within Functional Limits Conversation: Simple Interfering Components: Attention;Pain    Expression Verbal Expression Overall Verbal Expression: Appears within functional limits for tasks assessed   Oral / Motor Oral Motor/Sensory Function Overall Oral Motor/Sensory Function: Appears within functional limits for tasks assessed  Motor Speech Overall Motor Speech: Appears within functional limits for tasks assessed   GO    Harlon Ditty, MA CCC-SLP [phone]  Claudine Mouton 02/07/2013, 8:55 AM

## 2013-02-08 ENCOUNTER — Encounter (HOSPITAL_COMMUNITY): Payer: Self-pay | Admitting: Radiology

## 2013-02-08 ENCOUNTER — Inpatient Hospital Stay (HOSPITAL_COMMUNITY): Payer: Medicare Other

## 2013-02-08 LAB — URINALYSIS, ROUTINE W REFLEX MICROSCOPIC
Bilirubin Urine: NEGATIVE
Hgb urine dipstick: NEGATIVE
Protein, ur: 100 mg/dL — AB
Urobilinogen, UA: 0.2 mg/dL (ref 0.0–1.0)

## 2013-02-08 LAB — LIPID PANEL
Cholesterol: 163 mg/dL (ref 0–200)
Total CHOL/HDL Ratio: 2.4 RATIO
VLDL: 12 mg/dL (ref 0–40)

## 2013-02-08 LAB — CBC WITH DIFFERENTIAL/PLATELET
Basophils Relative: 0 % (ref 0–1)
Eosinophils Absolute: 0 10*3/uL (ref 0.0–0.7)
Eosinophils Relative: 0 % (ref 0–5)
MCH: 30.6 pg (ref 26.0–34.0)
MCHC: 33.2 g/dL (ref 30.0–36.0)
MCV: 92.1 fL (ref 78.0–100.0)
Neutrophils Relative %: 85 % — ABNORMAL HIGH (ref 43–77)
Platelets: 122 10*3/uL — ABNORMAL LOW (ref 150–400)
RDW: 13.6 % (ref 11.5–15.5)

## 2013-02-08 LAB — BASIC METABOLIC PANEL
BUN: 25 mg/dL — ABNORMAL HIGH (ref 6–23)
CO2: 26 mEq/L (ref 19–32)
Calcium: 9.3 mg/dL (ref 8.4–10.5)
Creatinine, Ser: 1.01 mg/dL (ref 0.50–1.10)
GFR calc non Af Amer: 53 mL/min — ABNORMAL LOW (ref >90)
Glucose, Bld: 131 mg/dL — ABNORMAL HIGH (ref 70–99)
Sodium: 138 mEq/L (ref 135–145)

## 2013-02-08 LAB — URINE MICROSCOPIC-ADD ON

## 2013-02-08 MED ORDER — ROPINIROLE HCL 1 MG PO TABS
3.0000 mg | ORAL_TABLET | Freq: Every day | ORAL | Status: DC
  Administered 2013-02-08 – 2013-02-13 (×6): 3 mg via ORAL
  Filled 2013-02-08 (×7): qty 3

## 2013-02-08 MED ORDER — HYDROMORPHONE HCL PF 1 MG/ML IJ SOLN
2.0000 mg | INTRAMUSCULAR | Status: DC | PRN
  Administered 2013-02-08 – 2013-02-09 (×4): 2 mg via INTRAVENOUS
  Filled 2013-02-08 (×5): qty 2

## 2013-02-08 MED ORDER — CIPROFLOXACIN IN D5W 400 MG/200ML IV SOLN
400.0000 mg | Freq: Two times a day (BID) | INTRAVENOUS | Status: DC
  Administered 2013-02-08 – 2013-02-13 (×11): 400 mg via INTRAVENOUS
  Filled 2013-02-08 (×12): qty 200

## 2013-02-08 MED ORDER — SIMVASTATIN 20 MG PO TABS
20.0000 mg | ORAL_TABLET | Freq: Every day | ORAL | Status: DC
  Administered 2013-02-08 – 2013-02-13 (×6): 20 mg via ORAL
  Filled 2013-02-08 (×7): qty 1

## 2013-02-08 MED ORDER — DIAZEPAM 2 MG PO TABS
2.0000 mg | ORAL_TABLET | Freq: Two times a day (BID) | ORAL | Status: DC
  Administered 2013-02-08 – 2013-02-14 (×12): 2 mg via ORAL
  Filled 2013-02-08 (×12): qty 1

## 2013-02-08 MED ORDER — FLUOXETINE HCL 20 MG PO CAPS
20.0000 mg | ORAL_CAPSULE | Freq: Every day | ORAL | Status: DC
  Administered 2013-02-08 – 2013-02-14 (×7): 20 mg via ORAL
  Filled 2013-02-08 (×7): qty 1

## 2013-02-08 MED ORDER — METOPROLOL TARTRATE 50 MG PO TABS
50.0000 mg | ORAL_TABLET | Freq: Two times a day (BID) | ORAL | Status: DC
  Administered 2013-02-08 – 2013-02-14 (×13): 50 mg via ORAL
  Filled 2013-02-08 (×14): qty 1

## 2013-02-08 NOTE — Evaluation (Signed)
Occupational Therapy Evaluation Patient Details Name: Tracey [name] MRN: [id number] DOB: 1936/08/03 Today's Date: 02/08/2013 Time: 1120-1140 OT Time Calculation (min): 20 min  OT Assessment / Plan / Recommendation Clinical Impression  Pt s/p Large hemorrhagic right PCA territory infarct. Will continue to follow acutely in order to address below problem list. Recommending SNF for d/c planning.    OT Assessment  Patient needs continued OT Services    Follow Up Recommendations  SNF;Supervision/Assistance - 24 hour    Barriers to Discharge      Equipment Recommendations  3 in 1 bedside comode    Recommendations for Other Services    Frequency  Min 3X/week    Precautions / Restrictions Precautions Precautions: Fall Restrictions Weight Bearing Restrictions: No   Pertinent Vitals/Pain See vitals    ADL  Eating/Feeding: Performed;Set up Where Assessed - Eating/Feeding: Chair Grooming: Performed;Moderate assistance;Brushing hair;Wash/dry face Where Assessed - Grooming: Supported sitting Upper Body Bathing: Simulated;Minimal assistance Where Assessed - Upper Body Bathing: Supported sitting Lower Body Bathing: Simulated;Maximal assistance Where Assessed - Lower Body Bathing: Supported sit to stand Upper Body Dressing: Simulated;Moderate assistance Where Assessed - Upper Body Dressing: Supported sitting Lower Body Dressing: Simulated;Maximal assistance Where Assessed - Lower Body Dressing: Supported sit to stand Toilet Transfer: Simulated;+2 Total assistance Toilet Transfer: Patient Percentage: 60% Statistician Method: Surveyor, minerals:  (bed to chair) Equipment Used: Gait belt Transfers/Ambulation Related to ADLs: +2 total assist for SPT from bed to chairs. VCs for sequencing.  Assist for steadying and weight shifting in order to advance LEs in order to pivot. ADL Comments: Pt very anxious and requiring increased time for mobility tasks. Left field  neglect noted during functional tasks.    OT Diagnosis: Generalized weakness;Cognitive deficits;Disturbance of vision;Paresis  OT Problem List: Decreased strength;Decreased activity tolerance;Impaired balance (sitting and/or standing);Impaired vision/perception;Decreased cognition;Decreased coordination;Decreased knowledge of use of DME or AE;Decreased knowledge of precautions;Impaired UE functional use OT Treatment Interventions: Self-care/ADL training;Neuromuscular education;DME and/or AE instruction;Therapeutic activities;Cognitive remediation/compensation;Visual/perceptual remediation/compensation;Patient/family education;Balance training   OT Goals Acute Rehab OT Goals OT Goal Formulation: With patient Time For Goal Achievement: 02/22/13 Potential to Achieve Goals: Good ADL Goals Pt Will Perform Grooming: with set-up;Sitting, chair;Sitting, edge of bed;Unsupported ADL Goal: Grooming - Progress: Goal set today Pt Will Perform Upper Body Bathing: with set-up;Sitting, chair;Sitting, edge of bed;Unsupported ADL Goal: Upper Body Bathing - Progress: Goal set today Pt Will Perform Lower Body Bathing: with supervision;Sit to stand from chair;Sit to stand from bed ADL Goal: Lower Body Bathing - Progress: Goal set today Pt Will Transfer to Toilet: with supervision;Stand pivot transfer;3-in-1 ADL Goal: Toilet Transfer - Progress: Goal set today Miscellaneous OT Goals Miscellaneous OT Goal #1: Pt will independently use compensatory strategies to locate 3/3 items on left side. OT Goal: Miscellaneous Goal #1 - Progress: Goal set today Miscellaneous OT Goal #2: Pt will perform bed mobility at supervision level as precursor for EOB ADLs OT Goal: Miscellaneous Goal #2 - Progress: Goal set today Miscellaneous OT Goal #3: Pt will perform static sitting task EOB >5 min at supervision level as precursor for EOB ADLs. OT Goal: Miscellaneous Goal #3 - Progress: Goal set today  Visit Information  Last OT  Received On: 02/08/13 Assistance Needed: +2 PT/OT Co-Evaluation/Treatment: Yes    Subjective Data  Subjective: "I'm afraid I will fall and hit my head!"   Prior Functioning     Home Living Lives With: Significant other Available Help at Discharge: Family Type of Home: House Home Access: Stairs to  enter Entrance Stairs-Number of Steps: 2 Entrance Stairs-Rails: None Home Layout: One level Bathroom Shower/Tub: Engineer, manufacturing systems: Handicapped height Home Adaptive Equipment: Straight cane;Walker - rolling;Wheelchair - manual;Other (comment) (lift chair) Additional Comments: Pt cooked but boyfriend prpared meds and got groceries.  Prior Function Level of Independence: Independent with assistive device(s) Able to Take Stairs?: Yes Driving: No Vocation: Retired Musician: No difficulties Dominant Hand: Right         Vision/Perception Vision - History Baseline Vision: Wears glasses all the time Patient Visual Report: Blurring of vision;Peripheral vision impairment Vision - Assessment Eye Alignment: Impaired (comment) Vision Assessment: Vision tested Tracking/Visual Pursuits: Impaired - to be further tested in functional context Visual Fields: Left visual field deficit;Impaired - to be further tested in functional context Depth Perception: Undershooting when reaching for objects. Additional Comments: Pt with great difficulty tracking to left past midline. Will continue to assess (difficulty to assess due to anxiety).    Cognition  Cognition Arousal/Alertness: Awake/alert Behavior During Therapy: Anxious (with all mobility) Overall Cognitive Status: Impaired/Different from baseline Area of Impairment: Orientation;Memory;Following commands;Safety/judgement;Awareness;Problem solving Orientation Level: Disoriented to;Situation;Time Memory: Decreased short-term memory Following Commands: Follows one step commands consistently;Follows one step  commands with increased time Safety/Judgement: Decreased awareness of deficits Problem Solving: Slow processing;Decreased initiation;Difficulty sequencing    Extremity/Trunk Assessment Right Upper Extremity Assessment RUE ROM/Strength/Tone: [facility] for tasks assessed Left Upper Extremity Assessment LUE ROM/Strength/Tone: Deficits LUE ROM/Strength/Tone Deficits: 3-/5 in shoulder.  3/5 elbow and hand. LUE Coordination: Deficits LUE Coordination Deficits: ataxic     Mobility Bed Mobility Bed Mobility: Rolling Right;Right Sidelying to Sit Rolling Right: 3: Mod assist Right Sidelying to Sit: 3: Mod assist;HOB elevated Details for Bed Mobility Assistance: cues and assist needed for technique and sequencing.  Transfers Transfers: Sit to Stand;Stand to Sit Sit to Stand: 1: +2 Total assist;From bed;With upper extremity assist Sit to Stand: Patient Percentage: 70% Stand to Sit: 1: +2 Total assist;To chair/3-in-1;With armrests;With upper extremity assist Stand to Sit: Patient Percentage: 70% Details for Transfer Assistance: cues needed for hand placement and for postural stability.  Steadying assist needed throughout transfer as pt shaky.  Pt with bil HHA to transfer stand pvot and take steps to chair.       Exercise General Exercises - Lower Extremity Long Arc Quad: AROM;Both;15 reps;Seated   Balance Balance Balance Assessed: Yes Static Sitting Balance Static Sitting - Balance Support: Bilateral upper extremity supported;Feet supported Static Sitting - Level of Assistance: 4: Min assist Static Sitting - Comment/# of Minutes: Min assist while sitting EOB 7 minutes. Pt becoming anxious while sitting EOB and c/o dizziness   End of Session OT - End of Session Equipment Utilized During Treatment: Gait belt Activity Tolerance: Patient limited by fatigue Patient left: in chair;with call bell/phone within reach Nurse Communication: Mobility status  GO   02/08/2013 Cipriano Mile  OTR/L Pager [phone] Office [phone]    Cipriano Mile 02/08/2013, 3:58 PM

## 2013-02-08 NOTE — Progress Notes (Signed)
Stroke Team Progress Note  HISTORY  Tracey [name] is a 77 y.o. female who was in her normal state of health until 9pm last night. At that time, she noticed vision changes and headache. This persisted and then this morning, she complained of some numbness and therefore her husband called 911.   Patient was not a TPA candidate secondary to ICH. She was admitted to the neuro ICU  3100 for further evaluation and treatment.  SUBJECTIVE No family is at bedside. The patient complains of a headache and nausea and vomiting. The left face and arm numbness has improved.  OBJECTIVE Most recent Vital Signs: Filed Vitals:   02/08/13 0430 02/08/13 0500 02/08/13 0600 02/08/13 0700  BP: 137/65 148/79 128/71 124/62  Pulse: 96 108 102 99  Temp:      TempSrc:      Resp: 16 13 8 11   Height:      Weight:      SpO2: 96% 93% 96% 95%   CBG (last 3)   Recent Labs  02/06/13 0707 02/06/13 0728  GLUCAP 150* 147*    IV Fluid Intake:   . sodium chloride 100 mL/hr at 02/08/13 0600  . niCARDipine 3 mg/hr (02/08/13 0630)    MEDICATIONS  . antiseptic oral rinse  15 mL Mouth Rinse BID  . ciprofloxacin  400 mg Intravenous Q12H  . pantoprazole (PROTONIX) IV  40 mg Intravenous QHS   PRN:  acetaminophen, acetaminophen, HYDROmorphone (DILAUDID) injection, labetalol, ondansetron (ZOFRAN) IV, traMADol  Diet:  NPO  Activity:  Up in chair DVT Prophylaxis:  SCD  CLINICALLY SIGNIFICANT STUDIES Basic Metabolic Panel:   Recent Labs Lab 02/06/13 0637 02/06/13 0706 02/08/13 0515  NA 136 137 138  K 3.4* 3.3* 3.5  CL 94* 96 103  CO2 30  --  26  GLUCOSE 166* 163* 131*  BUN 13 13 25*  CREATININE 0.96 1.10 1.01  CALCIUM 9.7  --  9.3   Liver Function Tests:   Recent Labs Lab 02/06/13 0637  AST 24  ALT 17  ALKPHOS 102  BILITOT 0.7  PROT 8.2  ALBUMIN 4.1   CBC:   Recent Labs Lab 02/06/13 0637 02/06/13 0706 02/08/13 0515  WBC 13.0*  --  17.9*  NEUTROABS 11.0*  --  15.3*  HGB 16.7* 18.0*  13.2  HCT 47.5* 53.0* 39.7  MCV 88.5  --  92.1  PLT 131*  --  122*   Coagulation:   Recent Labs Lab 02/06/13 0637  LABPROT 13.7  INR 1.06   Cardiac Enzymes:   Recent Labs Lab 02/06/13 0637  TROPONINI <0.30   Urinalysis: No results found for this basename: COLORURINE, APPERANCEUR, LABSPEC, PHURINE, GLUCOSEU, HGBUR, BILIRUBINUR, KETONESUR, PROTEINUR, UROBILINOGEN, NITRITE, LEUKOCYTESUR,  in the last 168 hours Lipid Panel    Component Value Date/Time   CHOL 163 02/08/2013 0515   HgbA1C  No results found for this basename: HGBA1C    Urine Drug Screen:   No results found for this basename: labopia,  cocainscrnur,  labbenz,  amphetmu,  thcu,  labbarb    Alcohol Level: No results found for this basename: ETH,  in the last 168 hours  Mr [facility] Wo Contrast  02/06/2013   *RADIOLOGY REPORT*  Clinical Data:  Headache with visual changes.  Left hemineglect. Left hemianopsia.  Decreased left facial sensation.  Stroke risk factors include hypertension, atrial fibrillation, heart failure, and prior cerebral infarction.  MRI HEAD WITHOUT CONTRAST MRA HEAD WITHOUT CONTRAST  Technique:  Multiplanar, multiecho pulse  sequences of the brain and surrounding structures were obtained without intravenous contrast. Angiographic images of the head were obtained using MRA technique without contrast.  Comparison:  MRI brain 12/02/2009.  CT head earlier today.  MRI HEAD  Findings:  There is a large hemorrhagic infarct involving much of the  posterior cerebral artery territory including the posterior and medial temporal lobe, medial occipital lobe,  and thalamus, all on the right.  Marked T2 shortening correlates with the CT appearance of acute intraparenchymal hemorrhage.  There is intraventricular extension of blood, greater on the right.  There is minimal 1-2 mm right-to-left midline shift.  The patient previously suffered a left PCA territory infarct affecting a similar distribution, which was also  hemorrhagic. These changes were less severe than those observed today.  There is resultant atrophy on today's scan of the cortex and subcortical white matter in the left occipital lobe with compensatory enlargement of the left lateral ventricle.  Mild generalized atrophy is present.  Mild chronic microvascular ischemic change affects the periventricular and subcortical white matter.  Calvarium skull base are intact.  Normal pituitary and cerebellar tonsils.  Upper cervical region unremarkable. Bilateral cataract extraction.  No sinus or mastoid disease. Absent flow void right vertebral artery.  Compared with the CT appearance earlier, when technique differences are considered, the hemorrhage is not clearly worse.  IMPRESSION: Large hemorrhagic right PCA territory infarct.  Intraventricular extension.  No significant midline shift.  Chronic left PCA territory infarct.  Mild atrophy and chronic microvascular ischemic change.  MRA HEAD  Findings: The right internal carotid artery  shows mild irregularity without flow limiting stenosis.  There is a 50% stenosis of the supraclinoid left internal carotid artery.  There is a shallow atheromatous outpouching from the supraclinoid right ICA, consistent with infundibulum, 2 mm in size.  The basilar artery is widely patent.  Right vertebral is occluded distally. This vessel was patent in 2011.  An acute thrombosis of this vessel is not excluded.  Left vertebral is dominant.  Distal left vertebral displays mild irregularity in the proximal V4 segment with a 50-75% stenosis (arrow) near the origin of the left PICA.  Severe disease A1 segment left anterior cerebral artery.  Mild nonstenotic irregularity right proximal A1 ACA.  Mild nonstenotic irregularity M1 segment  middle cerebral arteries. No visible MCA branch occlusion.  Focal occlusion ambient P2 segment right PCA could represent in situ thrombosis, an embolus, or extrinsic mass effect from the hemorrhagic infarct.  Left  PCA mildly irregular but otherwise patent.  No definite cerebellar branch occlusion.  IMPRESSION: Focal occlusion ambient P2 segment right PCA of uncertain etiology.  Right vertebral occlusion.  This vessel was patent in 2011; it is unclear if this occurred acutely, contributing to the patient's acute infarction.  50-75% stenosis distal left vertebral.  Intracranial atherosclerotic disease elsewhere as described.   Original Report Authenticated By: Davonna Belling, M.D.   Mr Brain Wo Contrast  02/06/2013   *RADIOLOGY REPORT*  Clinical Data:  Headache with visual changes.  Left hemineglect. Left hemianopsia.  Decreased left facial sensation.  Stroke risk factors include hypertension, atrial fibrillation, heart failure, and prior cerebral infarction.  MRI HEAD WITHOUT CONTRAST MRA HEAD WITHOUT CONTRAST  Technique:  Multiplanar, multiecho pulse sequences of the brain and surrounding structures were obtained without intravenous contrast. Angiographic images of the head were obtained using MRA technique without contrast.  Comparison:  MRI brain 12/02/2009.  CT head earlier today.  MRI HEAD  Findings:  There is a  large hemorrhagic infarct involving much of the  posterior cerebral artery territory including the posterior and medial temporal lobe, medial occipital lobe,  and thalamus, all on the right.  Marked T2 shortening correlates with the CT appearance of acute intraparenchymal hemorrhage.  There is intraventricular extension of blood, greater on the right.  There is minimal 1-2 mm right-to-left midline shift.  The patient previously suffered a left PCA territory infarct affecting a similar distribution, which was also hemorrhagic. These changes were less severe than those observed today.  There is resultant atrophy on today's scan of the cortex and subcortical white matter in the left occipital lobe with compensatory enlargement of the left lateral ventricle.  Mild generalized atrophy is present.  Mild chronic  microvascular ischemic change affects the periventricular and subcortical white matter.  Calvarium skull base are intact.  Normal pituitary and cerebellar tonsils.  Upper cervical region unremarkable. Bilateral cataract extraction.  No sinus or mastoid disease. Absent flow void right vertebral artery.  Compared with the CT appearance earlier, when technique differences are considered, the hemorrhage is not clearly worse.  IMPRESSION: Large hemorrhagic right PCA territory infarct.  Intraventricular extension.  No significant midline shift.  Chronic left PCA territory infarct.  Mild atrophy and chronic microvascular ischemic change.  MRA HEAD  Findings: The right internal carotid artery  shows mild irregularity without flow limiting stenosis.  There is a 50% stenosis of the supraclinoid left internal carotid artery.  There is a shallow atheromatous outpouching from the supraclinoid right ICA, consistent with infundibulum, 2 mm in size.  The basilar artery is widely patent.  Right vertebral is occluded distally. This vessel was patent in 2011.  An acute thrombosis of this vessel is not excluded.  Left vertebral is dominant.  Distal left vertebral displays mild irregularity in the proximal V4 segment with a 50-75% stenosis (arrow) near the origin of the left PICA.  Severe disease A1 segment left anterior cerebral artery.  Mild nonstenotic irregularity right proximal A1 ACA.  Mild nonstenotic irregularity M1 segment  middle cerebral arteries. No visible MCA branch occlusion.  Focal occlusion ambient P2 segment right PCA could represent in situ thrombosis, an embolus, or extrinsic mass effect from the hemorrhagic infarct.  Left PCA mildly irregular but otherwise patent.  No definite cerebellar branch occlusion.  IMPRESSION: Focal occlusion ambient P2 segment right PCA of uncertain etiology.  Right vertebral occlusion.  This vessel was patent in 2011; it is unclear if this occurred acutely, contributing to the patient's  acute infarction.  50-75% stenosis distal left vertebral.  Intracranial atherosclerotic disease elsewhere as described.   Original Report Authenticated By: Davonna Belling, M.D.    CT of the brain    IMPRESSION:  1. Large cerebral hemorrhage centered in the right posterior  temporal and occipital regions with surrounding vasogenic edema and  a small amount of local mass effect. There is intraventricular  hemorrhage within the right lateral ventricle at this time. No  associated hydrocephalus.  2. Mild cerebral and cerebellar atrophy  MRI of the brain   IMPRESSION:  Large hemorrhagic right PCA territory infarct. Intraventricular  extension. No significant midline shift.  Chronic left PCA territory infarct. Mild atrophy and chronic  microvascular ischemic change.  MRA of the brain   IMPRESSION:  Focal occlusion ambient P2 segment right PCA of uncertain etiology.  Right vertebral occlusion. This vessel was patent in 2011; it is  unclear if this occurred acutely, contributing to the patient's  acute infarction.  50-75% stenosis distal  left vertebral.  Intracranial atherosclerotic disease elsewhere as described.  2D Echocardiogram    Carotid Doppler   Carotid duplex completed.  Preliminary report: Bilateral: Less than 39% ICA stenosis. Vertebral artery flow is antegrade.   CXR    EKG   ATRIAL FIBRILLATION, V-RATE 58- 83 ~ var'd rate, irreg atrial activity PROBABLE LVH WITH SECONDARY REPOL ABNRM ~ R56L/RISIII/S12R56/S3RL & rep abn  Therapy Recommendations pending  Physical Exam  General: The patient is alert and cooperative at the time of the examination. The patient is moderately obese.  Respiratory: Lung fields are clear  Abdomen: Abdomen is moderately obese, positive bowel sounds, no tenderness  Skin: No significant peripheral edema is noted.   Neurologic Exam  Cranial nerves: Facial symmetry is present. Speech is normal, no aphasia or dysarthria is noted. Extraocular  movements are full, but the patient does have some difficulty with conjugate gaze to the left. Visual fields are notable for a dense left homonymous hemianopsia.  Motor: The patient has good strength in all 4 extremities.  Coordination: The patient has good finger-nose-finger and heel-to-shin bilaterally, with exception that the patient is unable to localize objects spatially with the left arm.  Gait and station: The gait was not tested. No drift is seen.  Reflexes: Deep tendon reflexes are symmetric.    ASSESSMENT Ms. Larraine Argo is a 77 y.o. female presenting with right posterior cerebral artery distribution stroke with subsequent hemorrhage. The patient has a history of atrial fibrillation, she has not been on any antiplatelet medications or anticoagulant medications for over one year secondary to an upper GI bleed from peptic ulcer disease that required blood transfusions. 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 The patient likely had an embolic stroke involving the right posterior cerebral artery, with subsequent hemorrhage.   Atrial fibrillation  History of peptic ulcer disease, upper GI bleed  Depression and anxiety  Gastroesophageal reflux disease  Hypertension  Congestive heart failure  Coronary artery disease  COPD  Obesity  Restless leg syndrome  Hospital day # 2  CT of the head today shows new intraventricular blood in the left lateral ventricle. ICH otherwise has not changed much.  TREATMENT/PLAN  No anti-coagulation or antiplatelet medications for now  Supportive care, follow CT evaluation of the brain  2-D echocardiogram done, results pending  Physical, occupational, and speech therapy  N.p.o. for now, the patient has nausea and vomiting  Follow clinical course  Morphine for headache  UA and culture, CXR  IV  cipro    02/08/2013 7:42 AM

## 2013-02-08 NOTE — Progress Notes (Signed)
Speech Language Pathology Treatment Patient Details Name: Krystall Kruckenberg MRN: [id number] DOB: Oct 26, 1935 Today's Date: 02/08/2013 Time: 4098-1191 SLP Time Calculation (min): 1421 min  Assessment / Plan / Recommendation Clinical Impression  Pt more participatory, SLP observed pt with functional tasks. Required moderate verbal tactile cues for problem solving given visual/perceptual deficits and ataxia. Goals updated. SLP therapy will continue.     SLP Plan  Continue with current plan of care    Pertinent Vitals/Pain NA  SLP Goals  SLP Goals Potential to Achieve Goals: Good SLP Goal #1: Pt will complete complex verbal and functional tasks with supervision cues for diagnostic treatment.  SLP Goal #1 - Progress: Not met SLP Goal #2: Pt will demonstrate adequate problem solving with basic functional ADL with min contextual cues.  SLP Goal #2 - Progress: Progressing toward goal SLP Goal #3: Pt will direct attention to left visual field x3 with moderate visual/tactile cues.  SLP Goal #3 - Progress: Progressing toward goal  General Temperature Spikes Noted: No Respiratory Status: Supplemental O2 delivered via (comment) Behavior/Cognition: Alert;Cooperative Oral Cavity - Dentition: Adequate natural dentition Patient Positioning: Upright in bed  Oral Cavity - Oral Hygiene     Treatment Treatment focused on: Cognition   GO    Harlon Ditty, [place] CCC-SLP [phone]  Claudine Mouton 02/08/2013, 9:50 AM

## 2013-02-08 NOTE — Progress Notes (Signed)
Physical Therapy Treatment Patient Details Name: Tracey [name] MRN: [id number] DOB: [birth year] Today's Date: 02/08/2013 Time: 7829-5621 PT Time Calculation (min): 24 min  PT Assessment / Plan / Recommendation Comments on Treatment Session  Pt s/p Right CVA with left visual disturbance and disconjugate gaze.  Pt anxious.  Pt able to transfer to chair with 2 person assist.  Will need SNF with therapy at d/c.      Follow Up Recommendations  SNF;Supervision/Assistance - 24 hour                 Equipment Recommendations  Other (comment) (TBa)        Frequency Min 3X/week   Plan Discharge plan remains appropriate;Frequency remains appropriate    Precautions / Restrictions Precautions Precautions: Fall Restrictions Weight Bearing Restrictions: No   Pertinent Vitals/Pain VSS, Headache - nurse aware    Mobility  Bed Mobility Bed Mobility: Rolling Right;Right Sidelying to Sit Rolling Right: 3: Mod assist Right Sidelying to Sit: 3: Mod assist;HOB elevated Details for Bed Mobility Assistance: cues and assist needed for technique and sequencing.  Pt still dizzy but better than yesterday.  Shaky at times as well.   Transfers Transfers: Sit to Stand;Stand to Sit;Stand Pivot Transfers Sit to Stand: 1: +2 Total assist;From bed;With upper extremity assist Sit to Stand: Patient Percentage: 70% Stand to Sit: 1: +2 Total assist;To chair/3-in-1;With armrests;With upper extremity assist Stand to Sit: Patient Percentage: 70% Stand Pivot Transfers: 1: +2 Total assist Stand Pivot Transfers: Patient Percentage: 60% Details for Transfer Assistance: cues needed for hand placement and for postural stability.  Steadying assist needed throughout transfer as pt shaky.  Pt with bil HHA to transfer stand pvot and take steps to chair.   Ambulation/Gait Ambulation/Gait Assistance: Not tested (comment) Stairs: No Wheelchair Mobility Wheelchair Mobility: No Modified Rankin (Stroke Patients  Only) Pre-Morbid Rankin Score: Moderate disability Modified Rankin: Moderately severe disability    Exercises General Exercises - Lower Extremity Long Arc Quad: AROM;Both;15 reps;Seated    PT Goals Acute Rehab PT Goals Pt will go Supine/Side to Sit: with supervision PT Goal: Supine/Side to Sit - Progress: Progressing toward goal Pt will go Sit to Stand: with supervision;with upper extremity assist PT Goal: Sit to Stand - Progress: Progressing toward goal Pt will Ambulate: 51 - 150 feet;with supervision;with least restrictive assistive device PT Goal: Ambulate - Progress: Progressing toward goal  Visit Information  Last PT Received On: 02/08/13 Assistance Needed: +2 PT/OT Co-Evaluation/Treatment: Yes    Subjective Data  Subjective: "I feel so weak."   Cognition  Cognition Arousal/Alertness: Lethargic Behavior During Therapy: Flat affect Overall Cognitive Status: Impaired/Different from baseline Area of Impairment: Orientation;Memory;Following commands;Safety/judgement;Awareness;Problem solving Orientation Level: Disoriented to;Situation;Time Following Commands: Follows one step commands consistently Safety/Judgement: Decreased awareness of deficits;Decreased awareness of safety Problem Solving: Difficulty sequencing;Requires verbal cues    Balance  Static Sitting Balance Static Sitting - Balance Support: Bilateral upper extremity supported;Feet supported Static Sitting - Level of Assistance: 4: Min assist Static Sitting - Comment/# of Minutes: Sat EOB 7 min with min assist.  Still shaky but able to stand and get into chair.    End of Session PT - End of Session Equipment Utilized During Treatment: Gait belt;Oxygen Activity Tolerance: Patient limited by fatigue Patient left: with call bell/phone within reach;in chair Nurse Communication: Mobility status;Need for lift equipment        INGOLD,Aimar Borghi 02/08/2013, 1:44 PM [facility] Acute  Rehabilitation [phone] (pager)

## 2013-02-08 NOTE — Evaluation (Signed)
Clinical/Bedside Swallow Evaluation Patient Details  Name: Tracey [name] MRN: [id number] Date of Birth: 12/06/1935  Today's Date: 02/08/2013 Time: 7829-5621 SLP Time Calculation (min): 21 min  Past Medical History:  Past Medical History  Diagnosis Date  . Atrial fibrillation   . Diastolic heart failure   . Depression   . Anxiety disorder   . GERD (gastroesophageal reflux disease)   . Low back pain   . Hypertension   . RLS (restless legs syndrome)   . CVA (cerebral infarction)   . CHF (congestive heart failure)   . Anemia   . Dyspnea   . Hyponatremia   . Pleural effusion   . Coronary artery disease   . COPD (chronic obstructive pulmonary disease)   . Stroke 2012    left sided deficits  . Bulging discs     receives epidurals for pain control   Past Surgical History:  Past Surgical History  Procedure Laterality Date  . Carpal tunnel release    . Cystectomy    . Cataract extraction    . Appendectomy    . Abdominal hysterectomy     HPI:  [name] is a 77 y.o. female presenting with right posterior cerebral artery distribution stroke with subsequent hemorrhage. The patient has a history of atrial fibrillation, she has not been on any antiplatelet medications or anticoagulant medications for over one year secondary to an upper GI bleed from peptic ulcer disease that required blood transfusions. 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 The patient likely had an embolic stroke involving the right posterior cerebral artery, with subsequent hemorrhage.   Assessment / Plan / Recommendation Clinical Impression  Pt does not show any signs of aspiration though she does have an abnormal breathing pattern with gasp/grunt after swallow. She reports baseline difficulty breathing. Oral mucosa also very dry, leaving oral residue. Recommend Dys 3 (mech  soft) diet and thin liquids. Pt instucted in basic aspiration precautions. Will f/u x1 for tolerance.     Aspiration Risk  Mild    Diet Recommendation Dysphagia 3 (Mechanical Soft);Thin liquid   Liquid Administration via: Cup;Straw Medication Administration: Whole meds with liquid Supervision: Patient able to self feed;Intermittent supervision to cue for compensatory strategies (will need assist) Compensations: Slow rate;Small sips/bites;Follow solids with liquid Postural Changes and/or Swallow Maneuvers: Seated upright 90 degrees    Other  Recommendations Oral Care Recommendations: Oral care BID   Follow Up Recommendations  Inpatient Rehab    Frequency and Duration min 2x/week  1 week   Pertinent Vitals/Pain NA    SLP Swallow Goals Patient will utilize recommended strategies during swallow to increase swallowing safety with: Minimal assistance Swallow Study Goal #2 - Progress: Progressing toward goal   Swallow Study Prior Functional Status       General HPI: [name] is a 77 y.o. female presenting with right posterior cerebral artery distribution stroke with subsequent hemorrhage. The patient has a history of atrial fibrillation, she has not been on any antiplatelet medications or anticoagulant medications for over one year secondary to an upper GI bleed from peptic ulcer disease that required blood transfusions. 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 The patient likely had an embolic stroke involving the right posterior cerebral artery, with subsequent  hemorrhage. Type of Study: Bedside swallow evaluation Diet Prior to this Study: NPO Temperature Spikes Noted: No Respiratory Status: Supplemental O2 delivered via (comment) History of Recent Intubation: No Behavior/Cognition: Alert;Cooperative Oral Cavity - Dentition: Adequate natural  dentition Self-Feeding Abilities: Able to feed self;Needs assist Patient Positioning: Upright in bed Baseline Vocal Quality: Hoarse;Breathy Volitional Cough: Strong Volitional Swallow: Able to elicit    Oral/Motor/Sensory Function Overall Oral Motor/Sensory Function: Appears within functional limits for tasks assessed   Ice Chips     Thin Liquid Thin Liquid: Within functional limits    Nectar Thick     Honey Thick     Puree Puree: Within functional limits   Solid   GO    Solid: Impaired Presentation: Self Fed Oral Phase Functional Implications: Oral residue      [facility], [place] CCC-SLP [phone]  Claudine Mouton 02/08/2013,9:42 AM

## 2013-02-09 ENCOUNTER — Inpatient Hospital Stay (HOSPITAL_COMMUNITY): Payer: Medicare Other

## 2013-02-09 DIAGNOSIS — I10 Essential (primary) hypertension: Secondary | ICD-10-CM

## 2013-02-09 LAB — CBC WITH DIFFERENTIAL/PLATELET
Basophils Absolute: 0 10*3/uL (ref 0.0–0.1)
Eosinophils Absolute: 0 10*3/uL (ref 0.0–0.7)
Eosinophils Relative: 0 % (ref 0–5)
MCH: 29.9 pg (ref 26.0–34.0)
MCHC: 32.3 g/dL (ref 30.0–36.0)
MCV: 92.8 fL (ref 78.0–100.0)
Platelets: 102 10*3/uL — ABNORMAL LOW (ref 150–400)
RDW: 13.8 % (ref 11.5–15.5)

## 2013-02-09 LAB — URINE CULTURE: Culture: NO GROWTH

## 2013-02-09 LAB — BASIC METABOLIC PANEL
Calcium: 9.2 mg/dL (ref 8.4–10.5)
GFR calc non Af Amer: 47 mL/min — ABNORMAL LOW (ref >90)
Sodium: 136 mEq/L (ref 135–145)

## 2013-02-09 MED ORDER — HYDROMORPHONE HCL PF 1 MG/ML IJ SOLN
1.0000 mg | INTRAMUSCULAR | Status: DC | PRN
  Administered 2013-02-09 – 2013-02-13 (×18): 1 mg via INTRAVENOUS
  Filled 2013-02-09 (×5): qty 1
  Filled 2013-02-09: qty 2
  Filled 2013-02-09 (×13): qty 1

## 2013-02-09 MED ORDER — PANTOPRAZOLE SODIUM 40 MG PO TBEC
40.0000 mg | DELAYED_RELEASE_TABLET | Freq: Every day | ORAL | Status: DC
  Administered 2013-02-09 – 2013-02-13 (×5): 40 mg via ORAL
  Filled 2013-02-09 (×5): qty 1

## 2013-02-09 MED ORDER — HYDROMORPHONE HCL PF 1 MG/ML IJ SOLN
2.0000 mg | Freq: Once | INTRAMUSCULAR | Status: AC
  Administered 2013-02-09: 1.5 mg via INTRAVENOUS

## 2013-02-09 NOTE — Progress Notes (Signed)
UR completed 

## 2013-02-09 NOTE — Progress Notes (Signed)
Stroke Team Progress Note  HISTORY  Tracey [name] is a 77 y.o. female who was in her normal state of health until 9pm last night. At that time, she noticed vision changes and headache. This persisted and then this morning, she complained of some numbness and therefore her husband called 911.   Patient was not a TPA candidate secondary to ICH. She was admitted to the neuro ICU  3100 for further evaluation and treatment.  SUBJECTIVE No family is at bedside. The patient complains of a headache and nausea and vomiting. The left face and arm numbness has improved.  OBJECTIVE Most recent Vital Signs: Filed Vitals:   02/09/13 0430 02/09/13 0500 02/09/13 0600 02/09/13 0753  BP: 149/63 152/75 151/77   Pulse: 77 83 79   Temp:    98.8 F (37.1 C)  TempSrc:    Axillary  Resp: 15 13 16    Height:      Weight:      SpO2: 96% 93% 96%    CBG (last 3)  No results found for this basename: GLUCAP,  in the last 72 hours  IV Fluid Intake:   . sodium chloride 100 mL/hr at 02/09/13 0600  . niCARDipine 5 mg/hr (02/09/13 0600)    MEDICATIONS  . antiseptic oral rinse  15 mL Mouth Rinse BID  . ciprofloxacin  400 mg Intravenous Q12H  . diazepam  2 mg Oral BID  . FLUoxetine  20 mg Oral Daily  . metoprolol tartrate  50 mg Oral BID  . pantoprazole (PROTONIX) IV  40 mg Intravenous QHS  . rOPINIRole  3 mg Oral QHS  . simvastatin  20 mg Oral q1800   PRN:  acetaminophen, acetaminophen, HYDROmorphone (DILAUDID) injection, labetalol, ondansetron (ZOFRAN) IV, traMADol  Diet:  Dysphagia  Activity:  Up in chair DVT Prophylaxis:  SCD  CLINICALLY SIGNIFICANT STUDIES Basic Metabolic Panel:   Recent Labs Lab 02/08/13 0515 02/09/13 0425  NA 138 136  K 3.5 3.8  CL 103 103  CO2 26 24  GLUCOSE 131* 107*  BUN 25* 30*  CREATININE 1.01 1.12*  CALCIUM 9.3 9.2   Liver Function Tests:   Recent Labs Lab 02/06/13 0637  AST 24  ALT 17  ALKPHOS 102  BILITOT 0.7  PROT 8.2  ALBUMIN 4.1   CBC:    Recent Labs Lab 02/08/13 0515 02/09/13 0425  WBC 17.9* 12.8*  NEUTROABS 15.3* 10.8*  HGB 13.2 12.0  HCT 39.7 37.2  MCV 92.1 92.8  PLT 122* 102*   Coagulation:   Recent Labs Lab 02/06/13 0637  LABPROT 13.7  INR 1.06   Cardiac Enzymes:   Recent Labs Lab 02/06/13 1610  TROPONINI <0.30   Urinalysis:   Recent Labs Lab 02/08/13 0800  COLORURINE YELLOW  LABSPEC 1.020  PHURINE 6.0  GLUCOSEU NEGATIVE  HGBUR NEGATIVE  BILIRUBINUR NEGATIVE  KETONESUR NEGATIVE  PROTEINUR 100*  UROBILINOGEN 0.2  NITRITE NEGATIVE  LEUKOCYTESUR NEGATIVE   Lipid Panel    Component Value Date/Time   CHOL 163 02/08/2013 0515   HgbA1C  No results found for this basename: HGBA1C    Urine Drug Screen:   No results found for this basename: labopia,  cocainscrnur,  labbenz,  amphetmu,  thcu,  labbarb    Alcohol Level: No results found for this basename: ETH,  in the last 168 hours  Ct Head Wo Contrast  02/08/2013   *RADIOLOGY REPORT*  Clinical Data: Follow-up hemorrhagic right PCA infarct.  CT HEAD WITHOUT CONTRAST  Technique:  Contiguous  axial images were obtained from the base of the skull through the vertex without contrast.  Comparison: Head CT and MRI brain 02/06/2013.  Findings: Study is mildly degraded despite repeating several images.  The large hemorrhagic infarct within the right posterior cerebral artery distribution has not significantly changed.  This measures approximately 7.7 x 3.6 cm transverse.  There is progressive low density along the superior aspect of this hemorrhage.  There is also slightly progressive mass effect on the occipital horn of the right lateral ventricle.  A small amount of intraventricular hemorrhage is present within the occipital horns, greater on the left.  This is new from the CT, although apparent on the MRI.  No significant ventricular dilatation is identified.  Trace subarachnoid blood is noted along the left parietal convexity.  No other new areas of  intracranial hemorrhage are identified.  There is no midline shift or evidence of new infarction.  The visualized paranasal sinuses, mastoid air cells and middle ears are clear.  IMPRESSION:  1.  Evolving hemorrhagic infarct in the right PCA territory as described.  There is slightly increased local mass effect with increased intraventricular hemorrhage. 2.  Trace subarachnoid hemorrhage along the left parietal convexity.   Original Report Authenticated By: Carey Bullocks, M.D.   Dg Chest Port 1 View  02/08/2013   *RADIOLOGY REPORT*  Clinical Data: Fever  PORTABLE CHEST - 1 VIEW  Comparison: 02/11/2012  Findings: Mild cardiomegaly which is stable.  The lungs are well aerated without focal infiltrate. No significant vascular congestion is noted.  No bony abnormality is seen.  IMPRESSION: No acute abnormality noted.   Original Report Authenticated By: Alcide Clever, M.D.    CT of the brain    IMPRESSION:  1. Large cerebral hemorrhage centered in the right posterior  temporal and occipital regions with surrounding vasogenic edema and  a small amount of local mass effect. There is intraventricular  hemorrhage within the right lateral ventricle at this time. No  associated hydrocephalus.  2. Mild cerebral and cerebellar atrophy  MRI of the brain   IMPRESSION:  Large hemorrhagic right PCA territory infarct. Intraventricular  extension. No significant midline shift.  Chronic left PCA territory infarct. Mild atrophy and chronic  microvascular ischemic change.  MRA of the brain   IMPRESSION:  Focal occlusion ambient P2 segment right PCA of uncertain etiology.  Right vertebral occlusion. This vessel was patent in 2011; it is  unclear if this occurred acutely, contributing to the patient's  acute infarction.  50-75% stenosis distal left vertebral.  Intracranial atherosclerotic disease elsewhere as described.  2D Echocardiogram   Study Conclusions  - Left ventricle: The cavity size was normal.  Systolic function was normal. The estimated ejection fraction was in the range of 60% to 65%. Wall motion was normal; there were no regional wall motion abnormalities. - Mitral valve: Mild regurgitation. - Left atrium: The atrium was mildly dilated. - Atrial septum: No defect or patent foramen ovale was identified.   Carotid Doppler   Carotid duplex completed.  Preliminary report: Bilateral: Less than 39% ICA stenosis. Vertebral artery flow is antegrade.   CXR    EKG   ATRIAL FIBRILLATION, V-RATE 58- 83 ~ var'd rate, irreg atrial activity PROBABLE LVH WITH SECONDARY REPOL ABNRM ~ R56L/RISIII/S12R56/S3RL & rep abn  Therapy Recommendations pending  Physical Exam  General: The patient is very lethargic this am, will follow some commands. The patient is moderately obese.  Respiratory: Lung fields are clear  Abdomen: Abdomen is moderately obese,  positive bowel sounds, no tenderness  Skin: No significant peripheral edema is noted.   Neurologic Exam  Cranial nerves: Facial symmetry is present. Speech is normal, no aphasia or dysarthria is noted. Extraocular movements and visual fields are difficult to assess this am due to lethargy.  Motor: The patient has the ability to move all 4 extremities, mild drift with the left upper extremity is seen.  Coordination: The patient has good finger-nose-finger and heel-to-shin bilaterally, with exception that the patient is unable to localize objects spatially with the left arm.  Gait and station: The gait was not tested. No drift is seen.  Reflexes: Deep tendon reflexes are symmetric.    ASSESSMENT Ms. Marcine Gadway is a 77 y.o. female presenting with right posterior cerebral artery distribution stroke with subsequent hemorrhage. The patient has a history of atrial fibrillation, she has not been on any antiplatelet medications or anticoagulant medications for over one year secondary to an upper GI bleed from peptic ulcer disease that  required blood transfusions. 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 The patient likely had an embolic stroke involving the right posterior cerebral artery, with subsequent hemorrhage.   Atrial fibrillation  History of peptic ulcer disease, upper GI bleed  Depression and anxiety  Gastroesophageal reflux disease  Hypertension  Congestive heart failure  Coronary artery disease  COPD  Obesity  Restless leg syndrome  Hospital day # 3  The patient is quite lethargic today, difficult to get an examination. The patient received 2 mg of IV Dilaudid at 6 AM. The patient is still complaining of headache, nausea. The patient is on an oral diet, no reported vomiting. We will recheck CT scan of the head in the morning, as the last scan showed some new interventricular blood.  TREATMENT/PLAN  No anti-coagulation or antiplatelet medications for now  Supportive care  Physical, occupational, and speech therapy have seen, recommend SNF  Now on dys 3 diet  Follow clinical course  Dilaudid for headache  IV cipro  CT the head in a.m.    02/09/2013 8:27 AM

## 2013-02-10 ENCOUNTER — Inpatient Hospital Stay (HOSPITAL_COMMUNITY): Payer: Medicare Other

## 2013-02-10 LAB — CBC
MCH: 29.5 pg (ref 26.0–34.0)
Platelets: 109 10*3/uL — ABNORMAL LOW (ref 150–400)
RBC: 3.93 MIL/uL (ref 3.87–5.11)
RDW: 13.4 % (ref 11.5–15.5)
WBC: 11.9 10*3/uL — ABNORMAL HIGH (ref 4.0–10.5)

## 2013-02-10 LAB — BASIC METABOLIC PANEL
CO2: 22 mEq/L (ref 19–32)
Calcium: 8.9 mg/dL (ref 8.4–10.5)
Chloride: 103 mEq/L (ref 96–112)
GFR calc Af Amer: 74 mL/min — ABNORMAL LOW (ref >90)
Sodium: 134 mEq/L — ABNORMAL LOW (ref 135–145)

## 2013-02-10 MED ORDER — ONDANSETRON HCL 4 MG/2ML IJ SOLN
4.0000 mg | Freq: Once | INTRAMUSCULAR | Status: AC
  Administered 2013-02-10: 4 mg via INTRAVENOUS

## 2013-02-10 MED ORDER — POLYETHYLENE GLYCOL 3350 17 G PO PACK
17.0000 g | PACK | Freq: Every day | ORAL | Status: DC | PRN
  Filled 2013-02-10: qty 1

## 2013-02-10 NOTE — Progress Notes (Signed)
Speech Language Pathology Dysphagia Treatment Patient Details Name: Tracey [name] MRN: [id number] DOB: 06/29/1936 Today's Date: 02/10/2013 Time: 5784-6962 SLP Time Calculation (min): 8 min  Assessment / Plan / Recommendation Clinical Impression  Pts function impaired by pain and lethargy, decreased sustained attention to task. Clinician provided thin liquids via straw with max verbal cueing and min tactile cue to initiate cup to mouth. Pt observed with 1 sip of thin liquid with no s/s of aspiration, c/o feeling sick from drinking. Pt required max verbal cues to attend to task, oriented x3, however when questioned about events from the morning she demonstrated difficulty providing appropriate response, c/o of headache.     Diet Recommendation  Continue with Current Diet: Dysphagia 3 (mechanical soft);Thin liquid    SLP Plan Continue with current plan of care   Pertinent Vitals/Pain Headache, meds previously provided   Swallowing Goals  SLP Swallowing Goals Patient will utilize recommended strategies during swallow to increase swallowing safety with: Minimal assistance Swallow Study Goal #2 - Progress: Progressing toward goal  General Temperature Spikes Noted: No Respiratory Status: Supplemental O2 delivered via (comment) Behavior/Cognition: Lethargic;Requires cueing;Distractible;Decreased sustained attention Oral Cavity - Dentition: Adequate natural dentition Patient Positioning: Upright in bed  Oral Cavity - Oral Hygiene Does patient have any of the following "at risk" factors?: Oxygen therapy - cannula, mask, simple oxygen devices Patient is AT RISK - Oral Care Protocol followed (see row info): Yes   Dysphagia Treatment Treatment focused on: Skilled observation of diet tolerance Treatment Methods/Modalities: Skilled observation Type of PO's observed: Thin liquids Feeding: Able to feed self;Needs assist Liquids provided via: Straw Type of cueing: Verbal;Tactile Amount of  cueing: Maximal   GO     Tracey [name], Tracey [name] 02/10/2013, 2:42 PM

## 2013-02-10 NOTE — Progress Notes (Addendum)
Stroke Team Progress Note  HISTORY  Tracey [name] is a 77 y.o. female who was in her normal state of health until 9pm last night. At that time, she noticed vision changes and headache. This persisted and then this morning, she complained of some numbness and therefore her husband called 911.   Patient was not a TPA candidate secondary to ICH. She was admitted to the neuro ICU  3100 for further evaluation and treatment.  SUBJECTIVE No family is at bedside. The patient complains of a headache and nausea, no vomiting. Sleepy, slightly confused.  OBJECTIVE Most recent Vital Signs: Filed Vitals:   02/10/13 1400 02/10/13 1500 02/10/13 1559 02/10/13 1700  BP: 137/67 144/68  149/65  Pulse: 81 81  90  Temp:   98.2 F (36.8 C)   TempSrc:   Oral   Resp: 14 22  17   Height:      Weight:      SpO2: 92% 95%  99%   CBG (last 3)  No results found for this basename: GLUCAP,  in the last 72 hours  IV Fluid Intake:   . sodium chloride 75 mL/hr at 02/10/13 1600  . niCARDipine Stopped (02/10/13 1000)    MEDICATIONS  . antiseptic oral rinse  15 mL Mouth Rinse BID  . ciprofloxacin  400 mg Intravenous Q12H  . diazepam  2 mg Oral BID  . FLUoxetine  20 mg Oral Daily  . metoprolol tartrate  50 mg Oral BID  . pantoprazole  40 mg Oral QHS  . rOPINIRole  3 mg Oral QHS  . simvastatin  20 mg Oral q1800   PRN:  acetaminophen, acetaminophen, HYDROmorphone (DILAUDID) injection, labetalol, ondansetron (ZOFRAN) IV, polyethylene glycol, traMADol  Diet:  Dysphagia  Activity:  Up in chair DVT Prophylaxis:  SCD  CLINICALLY SIGNIFICANT STUDIES Basic Metabolic Panel:   Recent Labs Lab 02/10/13 0425 02/11/13 0549  NA 134* 135  K 3.6 3.2*  CL 103 100  CO2 22 27  GLUCOSE 115* 110*  BUN 24* 17  CREATININE 0.87 0.83  CALCIUM 8.9 8.4   Liver Function Tests:   Recent Labs Lab 02/06/13 0637  AST 24  ALT 17  ALKPHOS 102  BILITOT 0.7  PROT 8.2  ALBUMIN 4.1   CBC:   Recent Labs Lab  02/08/13 0515 02/09/13 0425 02/10/13 0425 02/11/13 0549  WBC 17.9* 12.8* 11.9* 11.9*  NEUTROABS 15.3* 10.8*  --   --   HGB 13.2 12.0 11.6* 11.0*  HCT 39.7 37.2 35.7* 33.3*  MCV 92.1 92.8 90.8 90.2  PLT 122* 102* 109* 95*   Coagulation:   Recent Labs Lab 02/06/13 0637  LABPROT 13.7  INR 1.06   Cardiac Enzymes:   Recent Labs Lab 02/06/13 1610  TROPONINI <0.30   Urinalysis:   Recent Labs Lab 02/08/13 0800  COLORURINE YELLOW  LABSPEC 1.020  PHURINE 6.0  GLUCOSEU NEGATIVE  HGBUR NEGATIVE  BILIRUBINUR NEGATIVE  KETONESUR NEGATIVE  PROTEINUR 100*  UROBILINOGEN 0.2  NITRITE NEGATIVE  LEUKOCYTESUR NEGATIVE   Lipid Panel    Component Value Date/Time   CHOL 163 02/08/2013 0515   HgbA1C  No results found for this basename: HGBA1C    Urine Drug Screen:   No results found for this basename: labopia,  cocainscrnur,  labbenz,  amphetmu,  thcu,  labbarb    Alcohol Level: No results found for this basename: ETH,  in the last 168 hours  Ct Head Wo Contrast  02/09/2013   *RADIOLOGY REPORT*  Clinical Data:  Intracranial hemorrhage follow-up  CT HEAD WITHOUT CONTRAST  Technique:  Contiguous axial images were obtained from the base of the skull through the vertex without contrast.  Comparison: CT 02/08/2013  Findings: Hemorrhagic infarct right posterior cerebral artery territory is unchanged.  There is  local mass effect without midline shift.  There is blood layering in the left occipital horn, unchanged.  No hydrocephalus.  No new infarction is identified compared with the prior study.  IMPRESSION: Hemorrhagic infarction right PCA territory is stable. Intraventricular hemorrhage also stable.  No new findings.   Original Report Authenticated By: Janeece Riggers, M.D.    CT of the brain    IMPRESSION:  1. Large cerebral hemorrhage centered in the right posterior  temporal and occipital regions with surrounding vasogenic edema and  a small amount of local mass effect. There is  intraventricular  hemorrhage within the right lateral ventricle at this time. No  associated hydrocephalus.  2. Mild cerebral and cerebellar atrophy  MRI of the brain   IMPRESSION:  Large hemorrhagic right PCA territory infarct. Intraventricular  extension. No significant midline shift.  Chronic left PCA territory infarct. Mild atrophy and chronic  microvascular ischemic change.  MRA of the brain   IMPRESSION:  Focal occlusion ambient P2 segment right PCA of uncertain etiology.  Right vertebral occlusion. This vessel was patent in 2011; it is  unclear if this occurred acutely, contributing to the patient's  acute infarction.  50-75% stenosis distal left vertebral.  Intracranial atherosclerotic disease elsewhere as described.  2D Echocardiogram   Study Conclusions  - Left ventricle: The cavity size was normal. Systolic function was normal. The estimated ejection fraction was in the range of 60% to 65%. Wall motion was normal; there were no regional wall motion abnormalities. - Mitral valve: Mild regurgitation. - Left atrium: The atrium was mildly dilated. - Atrial septum: No defect or patent foramen ovale was identified.   Carotid Doppler   Carotid duplex completed.  Preliminary report: Bilateral: Less than 39% ICA stenosis. Vertebral artery flow is antegrade.   CXR    EKG   ATRIAL FIBRILLATION, V-RATE 58- 83 ~ var'd rate, irreg atrial activity PROBABLE LVH WITH SECONDARY REPOL ABNRM ~ R56L/RISIII/S12R56/S3RL & rep abn  Therapy Recommendations pending  Physical Exam  General: The patient is very lethargic this am, will follow some commands. The patient is moderately obese.  Respiratory: Lung fields are clear  Abdomen: Abdomen is moderately obese, positive bowel sounds, no tenderness  Skin: No significant peripheral edema is noted.   Neurologic Exam  Cranial nerves: Facial symmetry is present. Speech is normal, no aphasia or dysarthria is noted. Extraocular  movements and visual fields are difficult to assess this am due to lethargy, but the patient is not seeing well even on the right side.  Motor: The patient has the ability to move all 4 extremities, mild drift with the left upper extremity is seen.  Coordination: The patient has good finger-nose-finger and heel-to-shin bilaterally, with exception that the patient is unable to localize objects spatially with the left arm.  Gait and station: The gait was not tested. No drift is seen.  Reflexes: Deep tendon reflexes are symmetric.    ASSESSMENT Ms. Beverely Suen is a 77 y.o. female presenting with right posterior cerebral artery distribution stroke with subsequent hemorrhage. The patient has a history of atrial fibrillation, she has not been on any antiplatelet medications or anticoagulant medications for over one year secondary to an upper GI bleed from peptic ulcer  disease that required blood transfusions. 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 The patient likely had an embolic stroke involving the right posterior cerebral artery, with subsequent hemorrhage.   Atrial fibrillation  History of peptic ulcer disease, upper GI bleed  Depression and anxiety  Gastroesophageal reflux disease  Hypertension  Congestive heart failure  Coronary artery disease  COPD  Obesity  Restless leg syndrome  Hospital day # 4    The patient is not able to see well in both VF, but no change on CT head.   TREATMENT/PLAN  No anti-coagulation or antiplatelet medications for now  Supportive care  Physical, occupational, and speech therapy have seen, recommend SNF  Follow clinical course  Dilaudid PRN for  for headache  IV cipro  WBC trending down  Replace K+ today  S/p EEG that shows diffuse slowing, no seizure activity.   Pt has increased bladder  residuals, will attempt strait cath and send a UA  Pt appears to be fluid overloaded and started on diet, will KVO IVF.  SBP <160, required labetalol. Off Cardene gtt right now. She is on lopressor 50 q12, if requires more anti hypertensive's will increased lopressor dose.     Pauletta Browns

## 2013-02-10 NOTE — Progress Notes (Signed)
Occupational Therapy Treatment Patient Details Name: Tracey [name] MRN: [id number] DOB: 07/16/1936 Today's Date: 02/10/2013 Time: 4098-1191 OT Time Calculation (min): 17 min  OT Assessment / Plan / Recommendation Comments on Treatment Session Pt continues to require +2 total assist with functional mobility.  Limited participation today. Will continue to assess vision next session.    Follow Up Recommendations  SNF;Supervision/Assistance - 24 hour    Barriers to Discharge       Equipment Recommendations  3 in 1 bedside comode    Recommendations for Other Services    Frequency Min 3X/week   Plan Discharge plan remains appropriate    Precautions / Restrictions Precautions Precautions: Fall   Pertinent Vitals/Pain See vitals    ADL  Toilet Transfer: Simulated;+2 Total assistance Toilet Transfer: Patient Percentage: 40% Toilet Transfer Method: Squat pivot;Sit to Barista:  (bed to chair) Equipment Used: Gait belt Transfers/Ambulation Related to ADLs: +2 total assist for sit<>stand x2 trials and for squat pivot from bed to chair. Pt initially stood with +2 assist with RW but was unable to move feet and turn RW in order to pivot into chair.  Removed RW and provided pt with +2 assist into chair. ADL Comments: Pt with poor participation in today's session.  Pt's O2 sats decreasing to low 80s on 4L nasal cannula during bed mobility and transfers.  Attempted to further assess vision this session while pt supine in bed (HOB elevated) but pt not following directions and providing inconsistent responses.  Pt still demo'ing difficulty locating items on left side but also seems to have trouble today tracking to right side.  Held clock up to pt on her right side and pt unable to visually locate clock.  Pt also having difficulty seeing chair on her right side during transfer (unsure if this is due to vision or lethargy?).    OT Diagnosis:    OT Problem List:   OT Treatment  Interventions:     OT Goals ADL Goals Pt Will Transfer to Toilet: with supervision;Stand pivot transfer;3-in-1 ADL Goal: Toilet Transfer - Progress: Progressing toward goals Miscellaneous OT Goals Miscellaneous OT Goal #1: Pt will independently use compensatory strategies to locate 3/3 items on left side. OT Goal: Miscellaneous Goal #1 - Progress: Progressing toward goals Miscellaneous OT Goal #2: Pt will perform bed mobility at supervision level as precursor for EOB ADLs OT Goal: Miscellaneous Goal #2 - Progress: Progressing toward goals Miscellaneous OT Goal #3: Pt will perform static sitting task EOB >5 min at supervision level as precursor for EOB ADLs. OT Goal: Miscellaneous Goal #3 - Progress: Progressing toward goals  Visit Information  Last OT Received On: 02/10/13 Assistance Needed: +2 PT/OT Co-Evaluation/Treatment: Yes    Subjective Data      Prior Functioning       Cognition  Cognition Arousal/Alertness: Lethargic Behavior During Therapy: Anxious Overall Cognitive Status: Impaired/Different from baseline Area of Impairment: Orientation;Memory;Following commands;Safety/judgement;Awareness;Problem solving Orientation Level: Disoriented to;Situation;Time Memory: Decreased short-term memory Following Commands: Follows one step commands with increased time;Follows one step commands inconsistently Safety/Judgement: Decreased awareness of deficits;Decreased awareness of safety Problem Solving: Slow processing;Decreased initiation;Difficulty sequencing;Requires verbal cues;Requires tactile cues General Comments: Pt much worse today with pt not following commands.  Needed max encouragement to participate as well.      Mobility  Bed Mobility Bed Mobility: Rolling Right;Right Sidelying to Sit Rolling Right: 1: +2 Total assist Rolling Right: Patient Percentage: 30% Right Sidelying to Sit: HOB elevated;1: +2 Total assist Right Sidelying to  Sit: Patient Percentage:  30% Details for Bed Mobility Assistance: Multimodal cues and assist needed throughout. Transfers Transfers: Sit to Stand;Stand to Sit Sit to Stand: 1: +2 Total assist;From bed;With upper extremity assist Sit to Stand: Patient Percentage: 40% Stand to Sit: 1: +2 Total assist;To chair/3-in-1;With armrests;With upper extremity assist Stand to Sit: Patient Percentage: 40% Details for Transfer Assistance: Pt requiring max encouragement to participate in OOB transfer. Assist for steadying/balance needed throughout.  Pt leaning posteriorly with LEs supported on bed.     Exercises      Balance Balance Balance Assessed: Yes Static Sitting Balance Static Sitting - Balance Support: Bilateral upper extremity supported;Feet supported Static Sitting - Level of Assistance: 3: Mod assist Static Sitting - Comment/# of Minutes: Incr level of assist today due to right lateral lean while sitting EOB.   End of Session OT - End of Session Equipment Utilized During Treatment: Gait belt Activity Tolerance: Patient limited by fatigue Patient left: in chair;with call bell/phone within reach Nurse Communication: Mobility status (pt's O2 sats dropped to low 80s with mobility.)  GO   02/10/2013 Cipriano Mile OTR/L Pager [phone] Office [phone]   Cipriano Mile 02/10/2013, 2:47 PM

## 2013-02-10 NOTE — Progress Notes (Signed)
EEG Completed; Results Pending  

## 2013-02-10 NOTE — Progress Notes (Signed)
Physical Therapy Treatment Patient Details Name: Tracey [name] MRN: [id number] DOB: 10-29-1935 Today's Date: 02/10/2013 Time: 4098-1191 PT Time Calculation (min): 28 min  PT Assessment / Plan / Recommendation Comments on Treatment Session  Pt s/p Right CVA with left visual disturbance and disconjugate gaze.  Pt anxious.  Pt able to transfer to chair with 2 person assist.  Will need SNF with therapy at d/c.      Follow Up Recommendations  SNF;Supervision/Assistance - 24 hour                 Equipment Recommendations  Other (comment) (TBa)        Frequency Min 3X/week   Plan Discharge plan remains appropriate;Frequency remains appropriate    Precautions / Restrictions Precautions Precautions: Fall Restrictions Weight Bearing Restrictions: No   Pertinent Vitals/Pain Desat to 85% on RA, after rest in chair, 94%.  Other VSS.  HA pain.    Mobility  Bed Mobility Bed Mobility: Rolling Right;Right Sidelying to Sit Rolling Right: 1: +2 Total assist Rolling Right: Patient Percentage: 30% Right Sidelying to Sit: HOB elevated;1: +2 Total assist Right Sidelying to Sit: Patient Percentage: 30% Details for Bed Mobility Assistance: cues and assist needed for technique and sequencing.  Transfers Transfers: Sit to Stand;Stand to Sit;Stand Pivot Transfers Sit to Stand: 1: +2 Total assist;From bed;With upper extremity assist Sit to Stand: Patient Percentage: 40% Stand to Sit: 1: +2 Total assist;To chair/3-in-1;With armrests;With upper extremity assist Stand to Sit: Patient Percentage: 40% Stand Pivot Transfers: 1: +2 Total assist Stand Pivot Transfers: Patient Percentage: 40% Details for Transfer Assistance: Pt needed 3 attempts to stand and pivot to chair.  Pt unable to  participate fully even with max encouragement.  Pt needed cues for hand placement and for postural stability.  Steadying assist needed throughout transfer as pt shaky.  Attempted to stand to RW in which pt was able to  hold onto RW and stand but could not move feet and RW to pivot.  Pt with bil HHA to transfer however pt really did more of a squat pivot as she was unable to stand fully upright and needed assist to move her feet to take steps to chair.   Ambulation/Gait Ambulation/Gait Assistance: Not tested (comment) Stairs: No Wheelchair Mobility Wheelchair Mobility: No Modified Rankin (Stroke Patients Only) Pre-Morbid Rankin Score: Moderate disability Modified Rankin: Severe disability    PT Goals Acute Rehab PT Goals Pt will go Supine/Side to Sit: with supervision PT Goal: Supine/Side to Sit - Progress: Progressing toward goal Pt will go Sit to Stand: with supervision;with upper extremity assist PT Goal: Sit to Stand - Progress: Progressing toward goal  Visit Information  Last PT Received On: 02/10/13 Assistance Needed: +2 PT/OT Co-Evaluation/Treatment: Yes    Subjective Data  Subjective: "I can't do it."   Cognition  Cognition Arousal/Alertness: Lethargic Behavior During Therapy: Anxious (with all mobility) Overall Cognitive Status: Impaired/Different from baseline Area of Impairment: Orientation;Memory;Following commands;Safety/judgement;Awareness;Problem solving Orientation Level: Disoriented to;Situation;Time Memory: Decreased short-term memory Following Commands: Follows one step commands with increased time;Follows one step commands inconsistently Safety/Judgement: Decreased awareness of deficits;Decreased awareness of safety Problem Solving: Slow processing;Decreased initiation;Difficulty sequencing;Requires verbal cues;Requires tactile cues General Comments: Pt much worse today with pt not following commands.  Needed max encouragement to participate as well.      Balance  Static Sitting Balance Static Sitting - Balance Support: Bilateral upper extremity supported;Feet supported Static Sitting - Level of Assistance: 3: Mod assist Static Sitting - Comment/# of Minutes: Needed  incr  assist to sit EOB 2 minutes secondary pt leaning to right more today and just not assisting with mobility as much.    End of Session PT - End of Session Equipment Utilized During Treatment: Gait belt;Oxygen Activity Tolerance: Patient limited by fatigue Patient left: with call bell/phone within reach;in chair Nurse Communication: Mobility status;Need for lift equipment        INGOLD,Mozella Rexrode 02/10/2013, 11:07 AM  [facility] [phone] (pager)

## 2013-02-10 NOTE — Progress Notes (Signed)
Stroke Team Progress Note  HISTORY  Tracey [name] is a 77 y.o. female who was in her normal state of health until 9pm last night. At that time, she noticed vision changes and headache. This persisted and then this morning, she complained of some numbness and therefore her husband called 911.   Patient was not a TPA candidate secondary to ICH. She was admitted to the neuro ICU  3100 for further evaluation and treatment.  SUBJECTIVE No family is at bedside. The patient complains of a headache and nausea, no vomiting. Sleepy, slightly confused.  OBJECTIVE Most recent Vital Signs: Filed Vitals:   02/10/13 0400 02/10/13 0500 02/10/13 0600 02/10/13 0700  BP: 149/100 134/73 143/66 144/67  Pulse: 80 84 88 87  Temp: 97.3 F (36.3 C)     TempSrc: Oral     Resp: 16 16 16 14   Height:      Weight:      SpO2: 93% 97% 91% 92%   CBG (last 3)  No results found for this basename: GLUCAP,  in the last 72 hours  IV Fluid Intake:   . sodium chloride 100 mL/hr at 02/10/13 0700  . niCARDipine 5 mg/hr (02/10/13 0700)    MEDICATIONS  . antiseptic oral rinse  15 mL Mouth Rinse BID  . ciprofloxacin  400 mg Intravenous Q12H  . diazepam  2 mg Oral BID  . FLUoxetine  20 mg Oral Daily  . metoprolol tartrate  50 mg Oral BID  . pantoprazole  40 mg Oral QHS  . rOPINIRole  3 mg Oral QHS  . simvastatin  20 mg Oral q1800   PRN:  acetaminophen, acetaminophen, HYDROmorphone (DILAUDID) injection, labetalol, ondansetron (ZOFRAN) IV, traMADol  Diet:  Dysphagia  Activity:  Up in chair DVT Prophylaxis:  SCD  CLINICALLY SIGNIFICANT STUDIES Basic Metabolic Panel:   Recent Labs Lab 02/09/13 0425 02/10/13 0425  NA 136 134*  K 3.8 3.6  CL 103 103  CO2 24 22  GLUCOSE 107* 115*  BUN 30* 24*  CREATININE 1.12* 0.87  CALCIUM 9.2 8.9   Liver Function Tests:   Recent Labs Lab 02/06/13 0637  AST 24  ALT 17  ALKPHOS 102  BILITOT 0.7  PROT 8.2  ALBUMIN 4.1   CBC:   Recent Labs Lab 02/08/13 0515  02/09/13 0425 02/10/13 0425  WBC 17.9* 12.8* 11.9*  NEUTROABS 15.3* 10.8*  --   HGB 13.2 12.0 11.6*  HCT 39.7 37.2 35.7*  MCV 92.1 92.8 90.8  PLT 122* 102* 109*   Coagulation:   Recent Labs Lab 02/06/13 0637  LABPROT 13.7  INR 1.06   Cardiac Enzymes:   Recent Labs Lab 02/06/13 1610  TROPONINI <0.30   Urinalysis:   Recent Labs Lab 02/08/13 0800  COLORURINE YELLOW  LABSPEC 1.020  PHURINE 6.0  GLUCOSEU NEGATIVE  HGBUR NEGATIVE  BILIRUBINUR NEGATIVE  KETONESUR NEGATIVE  PROTEINUR 100*  UROBILINOGEN 0.2  NITRITE NEGATIVE  LEUKOCYTESUR NEGATIVE   Lipid Panel    Component Value Date/Time   CHOL 163 02/08/2013 0515   HgbA1C  No results found for this basename: HGBA1C    Urine Drug Screen:   No results found for this basename: labopia,  cocainscrnur,  labbenz,  amphetmu,  thcu,  labbarb    Alcohol Level: No results found for this basename: ETH,  in the last 168 hours  Ct Head Wo Contrast  02/09/2013   *RADIOLOGY REPORT*  Clinical Data: Intracranial hemorrhage follow-up  CT HEAD WITHOUT CONTRAST  Technique:  Contiguous axial images were obtained from the base of the skull through the vertex without contrast.  Comparison: CT 02/08/2013  Findings: Hemorrhagic infarct right posterior cerebral artery territory is unchanged.  There is  local mass effect without midline shift.  There is blood layering in the left occipital horn, unchanged.  No hydrocephalus.  No new infarction is identified compared with the prior study.  IMPRESSION: Hemorrhagic infarction right PCA territory is stable. Intraventricular hemorrhage also stable.  No new findings.   Original Report Authenticated By: Janeece Riggers, M.D.   Dg Chest Port 1 View  02/08/2013   *RADIOLOGY REPORT*  Clinical Data: Fever  PORTABLE CHEST - 1 VIEW  Comparison: 02/11/2012  Findings: Mild cardiomegaly which is stable.  The lungs are well aerated without focal infiltrate. No significant vascular congestion is noted.  No bony  abnormality is seen.  IMPRESSION: No acute abnormality noted.   Original Report Authenticated By: Alcide Clever, M.D.    CT of the brain    IMPRESSION:  1. Large cerebral hemorrhage centered in the right posterior  temporal and occipital regions with surrounding vasogenic edema and  a small amount of local mass effect. There is intraventricular  hemorrhage within the right lateral ventricle at this time. No  associated hydrocephalus.  2. Mild cerebral and cerebellar atrophy  MRI of the brain   IMPRESSION:  Large hemorrhagic right PCA territory infarct. Intraventricular  extension. No significant midline shift.  Chronic left PCA territory infarct. Mild atrophy and chronic  microvascular ischemic change.  MRA of the brain   IMPRESSION:  Focal occlusion ambient P2 segment right PCA of uncertain etiology.  Right vertebral occlusion. This vessel was patent in 2011; it is  unclear if this occurred acutely, contributing to the patient's  acute infarction.  50-75% stenosis distal left vertebral.  Intracranial atherosclerotic disease elsewhere as described.  2D Echocardiogram   Study Conclusions  - Left ventricle: The cavity size was normal. Systolic function was normal. The estimated ejection fraction was in the range of 60% to 65%. Wall motion was normal; there were no regional wall motion abnormalities. - Mitral valve: Mild regurgitation. - Left atrium: The atrium was mildly dilated. - Atrial septum: No defect or patent foramen ovale was identified.   Carotid Doppler   Carotid duplex completed.  Preliminary report: Bilateral: Less than 39% ICA stenosis. Vertebral artery flow is antegrade.   CXR    EKG   ATRIAL FIBRILLATION, V-RATE 58- 83 ~ var'd rate, irreg atrial activity PROBABLE LVH WITH SECONDARY REPOL ABNRM ~ R56L/RISIII/S12R56/S3RL & rep abn  Therapy Recommendations pending  Physical Exam  General: The patient is very lethargic this am, will follow some commands.  The patient is moderately obese.  Respiratory: Lung fields are clear  Abdomen: Abdomen is moderately obese, positive bowel sounds, no tenderness  Skin: No significant peripheral edema is noted.   Neurologic Exam  Cranial nerves: Facial symmetry is present. Speech is normal, no aphasia or dysarthria is noted. Extraocular movements and visual fields are difficult to assess this am due to lethargy, but the patient is not seeing well even on the right side.  Motor: The patient has the ability to move all 4 extremities, mild drift with the left upper extremity is seen.  Coordination: The patient has good finger-nose-finger and heel-to-shin bilaterally, with exception that the patient is unable to localize objects spatially with the left arm.  Gait and station: The gait was not tested. No drift is seen.  Reflexes: Deep tendon reflexes  are symmetric.    ASSESSMENT [name] is a 77 y.o. female presenting with right posterior cerebral artery distribution stroke with subsequent hemorrhage. The patient has a history of atrial fibrillation, she has not been on any antiplatelet medications or anticoagulant medications for over one year secondary to an upper GI bleed from peptic ulcer disease that required blood transfusions. 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 The patient likely had an embolic stroke involving the right posterior cerebral artery, with subsequent hemorrhage.   Atrial fibrillation  History of peptic ulcer disease, upper GI bleed  Depression and anxiety  Gastroesophageal reflux disease  Hypertension  Congestive heart failure  Coronary artery disease  COPD  Obesity  Restless leg syndrome  Hospital day # 4  The patient is quite lethargic today, difficult to get an examination. The patient received 2 mg of IV Dilaudid at 6  AM. The patient is still complaining of headache, nausea. The patient is on an oral diet, no reported vomiting.   The patient is not able to see well in both VF, but no change on CT head. Will get EEG to R/O occipital seizures. The patient remains quite lethargic.  TREATMENT/PLAN  No anti-coagulation or antiplatelet medications for now  Supportive care  Physical, occupational, and speech therapy have seen, recommend SNF  Now on dys 3 diet  Follow clinical course  Dilaudid for headache  IV cipro  Will check EEG    02/10/2013 7:55 AM

## 2013-02-10 NOTE — Procedures (Signed)
ELECTROENCEPHALOGRAM REPORT   Patient: Tracey [name]       Room #: 3105 EEG No. ID: [id number] Age: 77 y.o.        Sex: female Referring Physician: Anne Hahn Report Date:  02/10/2013        Interpreting Physician: Thana Farr D  History: [name] is an 77 y.o. female with vision changes and headache  Medications:  Scheduled: . antiseptic oral rinse  15 mL Mouth Rinse BID  . ciprofloxacin  400 mg Intravenous Q12H  . diazepam  2 mg Oral BID  . FLUoxetine  20 mg Oral Daily  . metoprolol tartrate  50 mg Oral BID  . pantoprazole  40 mg Oral QHS  . rOPINIRole  3 mg Oral QHS  . simvastatin  20 mg Oral q1800    Conditions of Recording:  This is a 16 channel EEG carried out with the patient in the drowsy state.  Description:  The  background activity is slow and consists of mostly theta and delta activity.  The posterior background rhythm is a 6Hz  theta at its best.  No evidence of stage II sleep was noted.  Full wakefulness could not be appreciated.  Hyperventilation and intermittent photic stimulation were not performed.     IMPRESSION: This EEG is characterized by slowing and although on this tracing this may represent normal drowse can not rule out a diffuse cerebral disturbance that is etiologically non-specific.     Thana Farr, MD [facility] [phone] 02/10/2013, 6:51 PM

## 2013-02-11 LAB — URINALYSIS, DIPSTICK ONLY
Bilirubin Urine: NEGATIVE
Hgb urine dipstick: NEGATIVE
Specific Gravity, Urine: 1.017 (ref 1.005–1.030)
Urobilinogen, UA: 0.2 mg/dL (ref 0.0–1.0)
pH: 5.5 (ref 5.0–8.0)

## 2013-02-11 LAB — CBC
MCV: 90.2 fL (ref 78.0–100.0)
Platelets: 95 10*3/uL — ABNORMAL LOW (ref 150–400)
RBC: 3.69 MIL/uL — ABNORMAL LOW (ref 3.87–5.11)
WBC: 11.9 10*3/uL — ABNORMAL HIGH (ref 4.0–10.5)

## 2013-02-11 LAB — BASIC METABOLIC PANEL
CO2: 27 mEq/L (ref 19–32)
Chloride: 100 mEq/L (ref 96–112)
Creatinine, Ser: 0.83 mg/dL (ref 0.50–1.10)
Potassium: 3.2 mEq/L — ABNORMAL LOW (ref 3.5–5.1)
Sodium: 135 mEq/L (ref 135–145)

## 2013-02-11 MED ORDER — PROMETHAZINE HCL 25 MG/ML IJ SOLN
6.2500 mg | Freq: Once | INTRAMUSCULAR | Status: AC
  Administered 2013-02-11: 6.25 mg via INTRAVENOUS
  Filled 2013-02-11: qty 1

## 2013-02-11 MED ORDER — POTASSIUM CHLORIDE CRYS ER 20 MEQ PO TBCR
20.0000 meq | EXTENDED_RELEASE_TABLET | Freq: Two times a day (BID) | ORAL | Status: AC
  Administered 2013-02-11 (×2): 20 meq via ORAL
  Filled 2013-02-11 (×2): qty 1

## 2013-02-12 NOTE — Progress Notes (Signed)
Stroke Team Progress Note  HISTORY  Tracey [name] is a 77 y.o. female who was in her normal state of health until 9pm last night. At that time, she noticed vision changes and headache. This persisted and then this morning, she complained of some numbness and therefore her husband called 911.   Patient was not a TPA candidate secondary to ICH. She was admitted to the neuro ICU  3100 for further evaluation and treatment.  SUBJECTIVE No family is at bedside. The patient complains of a headache and nausea, no vomiting. Sleepy, slightly confused. Mental status improved today and following commands.   OBJECTIVE Most recent Vital Signs: Filed Vitals:   02/12/13 0000 02/12/13 0100 02/12/13 0200 02/12/13 0400  BP: 138/62 129/72 124/53 150/79  Pulse: 86 83 77 112  Temp: 97.9 F (36.6 C)   98.3 F (36.8 C)  TempSrc: Oral   Oral  Resp: 15 16 13 20   Height:      Weight:      SpO2: 100% 100% 100% 91%   CBG (last 3)  No results found for this basename: GLUCAP,  in the last 72 hours  IV Fluid Intake:   . sodium chloride 30 mL (02/11/13 0914)  . niCARDipine Stopped (02/10/13 1000)    MEDICATIONS  . antiseptic oral rinse  15 mL Mouth Rinse BID  . ciprofloxacin  400 mg Intravenous Q12H  . diazepam  2 mg Oral BID  . FLUoxetine  20 mg Oral Daily  . metoprolol tartrate  50 mg Oral BID  . pantoprazole  40 mg Oral QHS  . rOPINIRole  3 mg Oral QHS  . simvastatin  20 mg Oral q1800   PRN:  acetaminophen, acetaminophen, HYDROmorphone (DILAUDID) injection, labetalol, ondansetron (ZOFRAN) IV, polyethylene glycol, traMADol  Diet:  Dysphagia  Activity:  Up in chair DVT Prophylaxis:  SCD  CLINICALLY SIGNIFICANT STUDIES Basic Metabolic Panel:   Recent Labs Lab 02/10/13 0425 02/11/13 0549  NA 134* 135  K 3.6 3.2*  CL 103 100  CO2 22 27  GLUCOSE 115* 110*  BUN 24* 17  CREATININE 0.87 0.83  CALCIUM 8.9 8.4   Liver Function Tests:   Recent Labs Lab 02/06/13 0637  AST 24  ALT 17   ALKPHOS 102  BILITOT 0.7  PROT 8.2  ALBUMIN 4.1   CBC:   Recent Labs Lab 02/08/13 0515 02/09/13 0425 02/10/13 0425 02/11/13 0549  WBC 17.9* 12.8* 11.9* 11.9*  NEUTROABS 15.3* 10.8*  --   --   HGB 13.2 12.0 11.6* 11.0*  HCT 39.7 37.2 35.7* 33.3*  MCV 92.1 92.8 90.8 90.2  PLT 122* 102* 109* 95*   Coagulation:   Recent Labs Lab 02/06/13 0637  LABPROT 13.7  INR 1.06   Cardiac Enzymes:   Recent Labs Lab 02/06/13 0637  TROPONINI <0.30   Urinalysis:   Recent Labs Lab 02/08/13 0800 02/11/13 1634  COLORURINE YELLOW  --   LABSPEC 1.020 1.017  PHURINE 6.0 5.5  GLUCOSEU NEGATIVE NEGATIVE  HGBUR NEGATIVE NEGATIVE  BILIRUBINUR NEGATIVE NEGATIVE  KETONESUR NEGATIVE NEGATIVE  PROTEINUR 100* NEGATIVE  UROBILINOGEN 0.2 0.2  NITRITE NEGATIVE NEGATIVE  LEUKOCYTESUR NEGATIVE NEGATIVE   Lipid Panel    Component Value Date/Time   CHOL 163 02/08/2013 0515   HgbA1C  No results found for this basename: HGBA1C    Urine Drug Screen:   No results found for this basename: labopia,  cocainscrnur,  labbenz,  amphetmu,  thcu,  labbarb    Alcohol Level: No results  found for this basename: ETH,  in the last 168 hours  No results found.  CT of the brain    IMPRESSION:  1. Large cerebral hemorrhage centered in the right posterior  temporal and occipital regions with surrounding vasogenic edema and  a small amount of local mass effect. There is intraventricular  hemorrhage within the right lateral ventricle at this time. No  associated hydrocephalus.  2. Mild cerebral and cerebellar atrophy  MRI of the brain   IMPRESSION:  Large hemorrhagic right PCA territory infarct. Intraventricular  extension. No significant midline shift.  Chronic left PCA territory infarct. Mild atrophy and chronic  microvascular ischemic change.  MRA of the brain   IMPRESSION:  Focal occlusion ambient P2 segment right PCA of uncertain etiology.  Right vertebral occlusion. This vessel was  patent in 2011; it is  unclear if this occurred acutely, contributing to the patient's  acute infarction.  50-75% stenosis distal left vertebral.  Intracranial atherosclerotic disease elsewhere as described.  2D Echocardiogram   Study Conclusions  - Left ventricle: The cavity size was normal. Systolic function was normal. The estimated ejection fraction was in the range of 60% to 65%. Wall motion was normal; there were no regional wall motion abnormalities. - Mitral valve: Mild regurgitation. - Left atrium: The atrium was mildly dilated. - Atrial septum: No defect or patent foramen ovale was identified.   Carotid Doppler   Carotid duplex completed.  Preliminary report: Bilateral: Less than 39% ICA stenosis. Vertebral artery flow is antegrade.   CXR    EKG   ATRIAL FIBRILLATION, V-RATE 58- 83 ~ var'd rate, irreg atrial activity PROBABLE LVH WITH SECONDARY REPOL ABNRM ~ R56L/RISIII/S12R56/S3RL & rep abn  Therapy Recommendations pending  Physical Exam  General: The patient is very lethargic this am, will follow some commands. The patient is moderately obese.  Respiratory: Lung fields are clear  Abdomen: Abdomen is moderately obese, positive bowel sounds, no tenderness  Skin: No significant peripheral edema is noted.   Neurologic Exam  Cranial nerves: Facial symmetry is present. Speech is normal, no aphasia or dysarthria is noted. Extraocular movements and visual fields are difficult to assess this am due to lethargy, but the patient is not seeing well even on the right side.  Motor: The patient has the ability to move all 4 extremities, mild drift with the left upper extremity is seen.  Coordination: The patient has good finger-nose-finger and heel-to-shin bilaterally, with exception that the patient is unable to localize objects spatially with the left arm.  Gait and station: The gait was not tested. No drift is seen.  Reflexes: Deep tendon reflexes are  symmetric.    ASSESSMENT [name] is a 77 y.o. female presenting with right posterior cerebral artery distribution stroke with subsequent hemorrhage. The patient has a history of atrial fibrillation, she has not been on any antiplatelet medications or anticoagulant medications for over one year secondary to an upper GI bleed from peptic ulcer disease that required blood transfusions. 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 The patient likely had an embolic stroke involving the right posterior cerebral artery, with subsequent hemorrhage.   Atrial fibrillation  History of peptic ulcer disease, upper GI bleed  Depression and anxiety  Gastroesophageal reflux disease  Hypertension  Congestive heart failure  Coronary artery disease  COPD  Obesity  Restless leg syndrome  Hospital day #  6    The patient is not able to see well in both VF, but no change on CT head.   TREATMENT/PLAN  No anti-coagulation or antiplatelet   Supportive care  Physical, occupational, and speech therapy have seen, recommend SNF  Follow clinical course  cipro  WBC trending down  S/p K+ supplementaionS/p EEG that shows diffuse slowing, no seizure activity.   Pt has increased bladder residuals, will attempt strait cath and send a UA  Pt appears to be fluid overloaded and started on diet, will KVO IVF.   Increased bladder residuals s/p multiple straight cath. Will place folley.   Poor PO intake, consult Nutrition for assistance.   SBP <160, required labetalol. Off Cardene >24hrs. She is on lopressor 50 q12, if requires more anti hypertensive's will increased lopressor dose.     Pauletta Browns

## 2013-02-12 NOTE — Progress Notes (Signed)
Clinical Social Work Department BRIEF PSYCHOSOCIAL ASSESSMENT 02/12/2013  Patient:  Tracey [name], Tracey [name]     Account Number:  [id number]     Admit date:  02/06/2013  Clinical Social Worker:  Robin Searing  Date/Time:  02/12/2013 11:07 AM  Referred by:  CSW  Date Referred:  02/10/2013 Referred for  SNF Placement   Other Referral:   Interview type:  Other - See comment Other interview type:   Patient's boyfriend- Tracey [name] [phone]    PSYCHOSOCIAL DATA Living Status:  FRIEND(S) Admitted from facility:   Level of care:   Primary support name:  Sister, boyfriend and son Primary support relationship to patient:  FAMILY Degree of support available:   good    CURRENT CONCERNS Current Concerns  Post-Acute Placement   Other Concerns:    SOCIAL WORK ASSESSMENT / PLAN patient admitted from home where she lives with her boyfriend, Tracey [name], in [place]-  her sister and one of her sons also live in [place]. They are agreeable to SNF for rehab at d/c- prefer [facility] in [place].   Assessment/plan status:  Other - See comment Other assessment/ plan:   FL2 to be faxed out for SNF options   Information/referral to community resources:   SNF  EMS  [facility]    PATIENT'S/FAMILY'S RESPONSE TO PLAN OF CARE: Patient not involved in conversation but her family and boyfriend are agreeable to beginning the SNF search- will fax to [facility] SNF's-       Reece Levy, MSW, [place] [phone]

## 2013-02-12 NOTE — Progress Notes (Signed)
PT and OT are recommending SNF for rehab at d/c- Spoke to patient's boyfriend, Iantha Fallen, who states patient's sister, Jamesetta So, is her next of kin- he and Ms. Oloughlin live in [place] and this would be preferred setting for SNF- Will fax FL2 to [facility] and advise- support offered to Iantha Fallen- will leave SNF list for their review-    Reece Levy, MSW, [employer] [phone]

## 2013-02-12 NOTE — Progress Notes (Addendum)
Clinical Social Work Department CLINICAL SOCIAL WORK PLACEMENT NOTE 02/12/2013  Patient:  DANIEL, JOHNDROW  Account Number:  [id number] Admit date:  02/06/2013  Clinical Social Worker:  Robin Searing  Date/time:  02/12/2013 11:58 AM  Clinical Social Work is seeking post-discharge placement for this patient at the following level of care:   SKILLED NURSING   (*CSW will update this form in Epic as items are completed)   02/12/2013  Patient/family provided with [facility] Department of Clinical Social Work's list of facilities offering this level of care within the geographic area requested by the patient (or if unable, by the patient's family).  02/12/2013  Patient/family informed of their freedom to choose among providers that offer the needed level of care, that participate in Medicare, Medicaid or managed care program needed by the patient, have an available bed and are willing to accept the patient.  02/12/2013  Patient/family informed of [facility]' ownership interest in [facility], as well as of the fact that they are under no obligation to receive care at this facility.  PASARR submitted to EDS on 02/06/2013 PASARR number received from EDS on 02/06/2013  FL2 transmitted to all facilities in geographic area requested by pt/family on  02/12/2013 FL2 transmitted to all facilities within larger geographic area on   Patient informed that his/her managed care company has contracts with or will negotiate with  certain facilities, including the following:     Patient/family informed of bed offers received:  02/13/13 Patient chooses bed at [facility] Physician recommends and patient chooses bed at    Patient to be transferred to [facility] and Rehab on  02/14/2013  (JS) Patient to be transferred to facility by Ambulance (JS)  The following physician request were entered in Epic:   Additional Comments: Reece Levy, MSW,  Theresia Majors [phone]

## 2013-02-13 ENCOUNTER — Inpatient Hospital Stay (HOSPITAL_COMMUNITY): Payer: Medicare Other

## 2013-02-13 LAB — CBC
Hemoglobin: 10.4 g/dL — ABNORMAL LOW (ref 12.0–15.0)
MCH: 29.2 pg (ref 26.0–34.0)
MCV: 90.4 fL (ref 78.0–100.0)
RBC: 3.56 MIL/uL — ABNORMAL LOW (ref 3.87–5.11)

## 2013-02-13 LAB — BASIC METABOLIC PANEL
BUN: 13 mg/dL (ref 6–23)
CO2: 32 mEq/L (ref 19–32)
Calcium: 8.4 mg/dL (ref 8.4–10.5)
Creatinine, Ser: 0.8 mg/dL (ref 0.50–1.10)
Glucose, Bld: 92 mg/dL (ref 70–99)

## 2013-02-13 MED ORDER — ENSURE COMPLETE PO LIQD: 237.0000 mL | Freq: Once | ORAL | Status: DC

## 2013-02-13 MED ORDER — ENSURE PUDDING PO PUDG
1.0000 | Freq: Three times a day (TID) | ORAL | Status: DC
  Administered 2013-02-13 – 2013-02-14 (×4): 1 via ORAL

## 2013-02-13 NOTE — Progress Notes (Signed)
Speech Language Pathology Treatment Patient Details Name: Tracey [name] MRN: [id number] DOB: 07-Mar-1936 Today's Date: 02/13/2013 Time: 7829-5621 SLP Time Calculation (min): 10 min  Assessment / Plan / Recommendation Clinical Impression  Treatment focused on cognition and diet tolerance. Pt continues to accept minimal trials of PO with SLP. takes 1-2 bites/sips and c/o nausea. Given that over several sessions there have been no signs of aspiration and no reports of struggle with PO other than poor intake, SLP will sign off diet orders. Cognitive impairment persists with pt requiring moderate verbal cues to sustain attention to functional and verbal tasks. SLP offered max verbal cues for pt directing attention to left visual field during functional tasks. Moderate assist for pt to utilize left hand to assist with attention and vision on left. Pt required moderate verbal cues to verbalize deficits. Poor participation persists due to pain.     SLP Plan  Continue with current plan of care    Pertinent Vitals/Pain NA  SLP Goals  SLP Goals Potential to Achieve Goals: Good Potential Considerations: Pain level SLP Goal #1: Pt will complete complex verbal and functional tasks with supervision cues for diagnostic treatment.  SLP Goal #1 - Progress: Discontinued (comment) SLP Goal #2: Pt will demonstrate adequate problem solving with basic functional ADL with min contextual cues.  SLP Goal #2 - Progress: Progressing toward goal SLP Goal #3: Pt will direct attention to left visual field x3 with moderate visual/tactile cues.  SLP Goal #3 - Progress: Progressing toward goal  General Temperature Spikes Noted: No Respiratory Status: Supplemental O2 delivered via (comment) Behavior/Cognition: Lethargic;Requires cueing;Distractible;Decreased sustained attention Oral Cavity - Dentition: Adequate natural dentition Patient Positioning: Upright in bed  Oral Cavity - Oral Hygiene Does patient have any of  the following "at risk" factors?: Oxygen therapy - cannula, mask, simple oxygen devices   Treatment Treatment focused on: Cognition   GO    [employer], [place] CCC-SLP [phone]  Claudine Mouton 02/13/2013, 3:48 PM

## 2013-02-13 NOTE — Progress Notes (Signed)
Stroke Team Progress Note  HISTORY Tracey [name] is a 77 y.o. female who was in her normal state of health until 9pm last night 02/05/2013. At that time, she noticed vision changes and headache. This persisted and then this morning 02/06/2013, she complained of some numbness and therefore her husband called 911.   Patient was not a TPA candidate secondary to ICH. She was admitted to the neuro ICU  3100 for further evaluation and treatment.  SUBJECTIVE Pt complains of headache. No other events.   OBJECTIVE Most recent Vital Signs: Filed Vitals:   02/13/13 0400 02/13/13 0700 02/13/13 0800 02/13/13 0809  BP:  157/79 149/71   Pulse:  81 80   Temp: 97.9 F (36.6 C)   98.1 F (36.7 C)  TempSrc: Oral   Oral  Resp:  13 12   Height:      Weight:      SpO2:  100% 100%    CBG (last 3)  No results found for this basename: GLUCAP,  in the last 72 hours  IV Fluid Intake:   . sodium chloride 30 mL/hr at 02/12/13 1825  . niCARDipine Stopped (02/10/13 1000)    MEDICATIONS  . antiseptic oral rinse  15 mL Mouth Rinse BID  . ciprofloxacin  400 mg Intravenous Q12H  . diazepam  2 mg Oral BID  . feeding supplement  237 mL Oral Once  . feeding supplement  1 Container Oral TID BM  . FLUoxetine  20 mg Oral Daily  . metoprolol tartrate  50 mg Oral BID  . pantoprazole  40 mg Oral QHS  . rOPINIRole  3 mg Oral QHS  . simvastatin  20 mg Oral q1800   PRN:  acetaminophen, acetaminophen, HYDROmorphone (DILAUDID) injection, labetalol, ondansetron (ZOFRAN) IV, polyethylene glycol, traMADol  Diet:  Dysphagia 3 thin liquids Activity:  Up in chair DVT Prophylaxis:  SCD  CLINICALLY SIGNIFICANT STUDIES Basic Metabolic Panel:   Recent Labs Lab 02/11/13 0549 02/13/13 0515  NA 135 133*  K 3.2* 3.3*  CL 100 95*  CO2 27 32  GLUCOSE 110* 92  BUN 17 13  CREATININE 0.83 0.80  CALCIUM 8.4 8.4   Liver Function Tests:  No results found for this basename: AST, ALT, ALKPHOS, BILITOT, PROT, ALBUMIN,  in  the last 168 hours CBC:   Recent Labs Lab 02/08/13 0515 02/09/13 0425  02/11/13 0549 02/13/13 0515  WBC 17.9* 12.8*  < > 11.9* 11.8*  NEUTROABS 15.3* 10.8*  --   --   --   HGB 13.2 12.0  < > 11.0* 10.4*  HCT 39.7 37.2  < > 33.3* 32.2*  MCV 92.1 92.8  < > 90.2 90.4  PLT 122* 102*  < > 95* 113*  < > = values in this interval not displayed. Coagulation:  No results found for this basename: LABPROT, INR,  in the last 168 hours Cardiac Enzymes:  No results found for this basename: CKTOTAL, CKMB, CKMBINDEX, TROPONINI,  in the last 168 hours Urinalysis:   Recent Labs Lab 02/08/13 0800 02/11/13 1634  COLORURINE YELLOW  --   LABSPEC 1.020 1.017  PHURINE 6.0 5.5  GLUCOSEU NEGATIVE NEGATIVE  HGBUR NEGATIVE NEGATIVE  BILIRUBINUR NEGATIVE NEGATIVE  KETONESUR NEGATIVE NEGATIVE  PROTEINUR 100* NEGATIVE  UROBILINOGEN 0.2 0.2  NITRITE NEGATIVE NEGATIVE  LEUKOCYTESUR NEGATIVE NEGATIVE   Lipid Panel    Component Value Date/Time   CHOL 163 02/08/2013 0515   HgbA1C  No results found for this basename: HGBA1C  Urine Drug Screen:   No results found for this basename: labopia,  cocainscrnur,  labbenz,  amphetmu,  thcu,  labbarb    Alcohol Level: No results found for this basename: ETH,  in the last 168 hours  No results found.  CT of the brain 02/09/2013 Hemorrhagic infarction right PCA territory is stable. Intraventricular hemorrhage also stable. No new findings. 02/08/2013   1. Evolving hemorrhagic infarct in the right PCA territory as described. There is slightly increased local mass effect with increased intraventricular hemorrhage. 2. Trace subarachnoid hemorrhage along the left parietal convexity. 02/06/2013 1. Large cerebral hemorrhage centered in the right posterior temporal and occipital regions with surrounding vasogenic edema and a small amount of local mass effect. There is intraventricular hemorrhage within the right lateral ventricle at this time. No associated  hydrocephalus.  2. Mild cerebral and cerebellar atrophy  MRI of the brain  02/06/2013 Large hemorrhagic right PCA territory infarct. Intraventricular extension. No significant midline shift. Chronic left PCA territory infarct. Mild atrophy and chronic microvascular ischemic change.  MRA of the brain  02/06/2013 Focal occlusion ambient P2 segment right PCA of uncertain etiology. Right vertebral occlusion. This vessel was patent in 2011; it is  unclear if this occurred acutely, contributing to the patient's acute infarction.  50-75% stenosis distal left vertebral. Intracranial atherosclerotic disease elsewhere as described.  2D Echocardiogram  Left ventricle: The cavity size was normal. Systolic function was normal. The estimated ejection fraction was in the range of 60% to 65%. Wall motion was normal; there were no regional wall motion abnormalities. - Mitral valve: Mild regurgitation. - Left atrium: The atrium was mildly dilated. - Atrial  septum: No defect or patent foramen ovale was identified.  Carotid Doppler  Bilateral: Less than 39% ICA stenosis. Vertebral artery flow is antegrade.   CXR  02/08/2013 No acute abnormality noted.  EKG  ATRIAL FIBRILLATION, PROBABLE LVH WITH SECONDARY REPOL ABNRM   EEG that shows diffuse slowing, no seizure activity.   Therapy Recommendations SNF  Physical Exam  General: The patient is awake, in mild distress due to headache. Will follow commands. The patient is moderately obese.  Respiratory: Lung fields are clear  Abdomen: Abdomen is moderately obese, positive bowel sounds, no tenderness  Skin: No significant peripheral edema is noted.  Cardiac:  atrial fibrillation   Neurologic Exam  Cranial nerves: Facial symmetry is present. Speech is normal, no aphasia or dysarthria is noted. Extraocular movements intact and visual fields with left HH.  Motor: The patient has the ability to move all 4 extremities, mild drift with the left upper extremity is  seen.  Coordination: The patient has good finger-nose-finger  Gait and station: The gait was not tested. No drift is seen.  Reflexes: Deep tendon reflexes are symmetric.  ASSESSMENT Tracey [name] is a 77 y.o. female presenting with right posterior cerebral artery distribution infarct with subsequent right PCA territory hemorrhage with intraventricular extension. The patient has a history of atrial fibrillation, she has not [name] on any antiplatelet medications or anticoagulant medications for over one year secondary to an upper GI bleed from peptic ulcer disease that required blood transfusions. 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 The patient likely had an embolic stroke involving the right posterior cerebral artery, with subsequent hemorrhage.    Atrial fibrillation  History of peptic ulcer disease, upper GI bleed  Depression and anxiety  Gastroesophageal reflux disease  accelerated Hypertension, BP 169/112  thrombocytopenia  Congestive heart failure  Coronary artery disease  COPD  Obesity  Restless leg syndrome  Leukocytosis, trending down 11.8 today  Increased bladder residuals s/p multiple straight cath. U Cx neg 6/18. On cipro.  Fluid overload   Poor po intake,  Obesity, Body mass index is 34.06 kg/(m^2).   Hospital day # 7  TREATMENT/PLAN  No anti-coagulation or antiplatelet   Repeat CT given ongoing headache  SNF planned at discharge   D/c cipro  SBP goal <180,  on lopressor 50 q12   Transfer to the floor  Annie Main, MSN, RN, ANVP-BC, ANP-BC, GNP-BC [facility] Pager: [phone] 02/13/2013 10:27 AM  I have personally obtained a history, examined the patient, evaluated imaging results, and formulated the assessment and plan of care. I agree with the above. Transfer to floor.  Suanne Marker, MD 02/13/2013, 6:25 PM Certified in Neurology, Neurophysiology and Neuroimaging [facility] - Stroke Team  Please refer to [email or web address] for on-call Stroke MD

## 2013-02-13 NOTE — Progress Notes (Signed)
Occupational Therapy Treatment Patient Details Name: Tracey [name] MRN: [id number] DOB: 12-20-1935 Today's Date: 02/13/2013 Time: 5621-3086 OT Time Calculation (min): 30 min  OT Assessment / Plan / Recommendation Comments on Treatment Session Improvement in mobility, sitting balance, and ability to assist with transfer to chair. Pt continues to have diplopia and anxiety related to visual deficits. Taping of L lens of glasses performed and instructed in vision stabilization technique.    Follow Up Recommendations  SNF;Supervision/Assistance - 24 hour    Barriers to Discharge       Equipment Recommendations       Recommendations for Other Services    Frequency Min 3X/week   Plan Discharge plan remains appropriate    Precautions / Restrictions Precautions Precautions: Fall Restrictions Weight Bearing Restrictions: No Other Position/Activity Restrictions: double vision   Pertinent Vitals/Pain  no c/o pain    ADL  Grooming: Wash/dry hands;Wash/dry face;Set up Where Assessed - Grooming: Supported sitting Transfers/Ambulation Related to ADLs: + 2 total, pt 50 % bed to chair. ADL Comments: C/o dizziness, nausea, instructed in vision stabilization and taped L lens of glasses to decrease diplopia.  Pt rather anxious.    OT Diagnosis:    OT Problem List:   OT Treatment Interventions:     OT Goals ADL Goals Pt Will Perform Grooming: with set-up;Sitting, chair;Sitting, edge of bed;Unsupported ADL Goal: Grooming - Progress: Progressing toward goals Pt Will Perform Upper Body Bathing: with set-up;Sitting, chair;Sitting, edge of bed;Unsupported Pt Will Perform Lower Body Bathing: with supervision;Sit to stand from chair;Sit to stand from bed Pt Will Transfer to Toilet: with supervision;Stand pivot transfer;3-in-1 ADL Goal: Toilet Transfer - Progress: Progressing toward goals Miscellaneous OT Goals Miscellaneous OT Goal #1: Pt will independently use compensatory strategies to locate  3/3 items on left side. OT Goal: Miscellaneous Goal #1 - Progress: Progressing toward goals Miscellaneous OT Goal #2: Pt will perform bed mobility at supervision level as precursor for EOB ADLs OT Goal: Miscellaneous Goal #2 - Progress: Progressing toward goals Miscellaneous OT Goal #3: Pt will perform static sitting task EOB >5 min at supervision level as precursor for EOB ADLs. OT Goal: Miscellaneous Goal #3 - Progress: Progressing toward goals  Visit Information  Last OT Received On: 02/13/13 Assistance Needed: +2 PT/OT Co-Evaluation/Treatment: Yes    Subjective Data      Prior Functioning       Cognition  Cognition Arousal/Alertness: Awake/alert Behavior During Therapy: Anxious Overall Cognitive Status: Impaired/Different from baseline Memory: Decreased short-term memory Following Commands: Follows one step commands with increased time Problem Solving: Slow processing;Decreased initiation;Difficulty sequencing;Requires verbal cues;Requires tactile cues General Comments: following commands and moving with increased time and facilitation needed for initiation    Mobility  Bed Mobility Bed Mobility: Supine to Sit;Sitting - Scoot to Edge of Bed Supine to Sit: 1: +2 Total assist Supine to Sit: Patient Percentage: 30% Sitting - Scoot to Edge of Bed: 1: +2 Total assist Sitting - Scoot to Edge of Bed: Patient Percentage: 40% Details for Bed Mobility Assistance: min assist for legs off bed, +2 for trunk upright with pt c/o nausea with movement needing increased time between transitions Transfers Sit to Stand: 1: +2 Total assist;From bed;With upper extremity assist Sit to Stand: Patient Percentage: 50% Stand to Sit: 1: +2 Total assist;To chair/3-in-1;With armrests;With upper extremity assist Stand to Sit: Patient Percentage: 50% Details for Transfer Assistance: assisted bilaterally for lateral weight shifts for stepping to chair and to keep center of gravity anterior.  Cues  throughout for  visual target to try to decrease dizziness symptoms.    Exercises  Other Exercises Other Exercises: taped glasses with left lens medially to improve vision due to diplopia   Balance Static Sitting Balance Static Sitting - Balance Support: Bilateral upper extremity supported;Feet supported Static Sitting - Level of Assistance: 4: Min assist;5: Stand by assistance Static Sitting - Comment/# of Minutes: sat edge of bed 8 minutes static with minguard to supervision.  Pt felt more secure with minguard   End of Session OT - End of Session Activity Tolerance: Patient limited by fatigue Patient left: in chair;with call bell/phone within reach Nurse Communication: Mobility status  GO     Evern Bio 02/13/2013, 11:24 AM [phone]

## 2013-02-13 NOTE — Progress Notes (Signed)
Physical Therapy Treatment Patient Details Name: Tracey [name] MRN: [id number] DOB: 1936-02-18 Today's Date: 02/13/2013 Time: 4098-1191 PT Time Calculation ([name]): 30 [name]  PT Assessment / Plan / Recommendation Comments on Treatment Session  Patient progressing with mobility, confidence, and decreased anxiety with time taken to explain cause of her symptoms and using visual fixation to decrease symptoms.  Continue to recommend SNF level therapies at d/c due to anxiety and slow to move due to dizziness/visual disturbances.    Follow Up Recommendations  SNF     Does the patient have the potential to tolerate intense rehabilitation   No  Barriers to Discharge  slow progress, extent of deficits      Equipment Recommendations  Other (comment) (TBA)    Recommendations for Other Services  None  Frequency [name] 3X/week   Plan Discharge plan remains appropriate;Frequency remains appropriate    Precautions / Restrictions Precautions Precautions: Fall Restrictions Weight Bearing Restrictions: No Other Position/Activity Restrictions: double vision   Pertinent Vitals/Pain No pain complaints    Mobility  Bed Mobility Bed Mobility: Supine to Sit;Sitting - Scoot to Edge of Bed Supine to Sit: 1: +2 Total assist Supine to Sit: Patient Percentage: 30% Sitting - Scoot to Edge of Bed: 1: +2 Total assist Sitting - Scoot to Edge of Bed: Patient Percentage: 40% Details for Bed Mobility Assistance: [name] assist for legs off bed, +2 for trunk upright with pt c/o nausea with movement needing increased time between transitions Transfers Sit to Stand: 1: +2 Total assist;From bed;With upper extremity assist Sit to Stand: Patient Percentage: 50% Stand to Sit: 1: +2 Total assist;To chair/3-in-1;With armrests;With upper extremity assist Stand to Sit: Patient Percentage: 50% Stand Pivot Transfers: 1: +2 Total assist Stand Pivot Transfers: Patient Percentage: 50% Details for Transfer Assistance: assisted  bilaterally for lateral weight shifts for stepping to chair and to keep center of gravity anterior.  Cues throughout for visual target to try to decrease dizziness symptoms. Modified Rankin (Stroke Patients Only) Pre-Morbid Rankin Score: Moderate disability Modified Rankin: Severe disability    Exercises Other Exercises Other Exercises: taped glasses with left lens medially to improve vision due to diplopia     PT Goals Acute Rehab PT Goals Pt will go Supine/Side to Sit: with supervision PT Goal: Supine/Side to Sit - Progress: Progressing toward goal Pt will go Sit to Stand: with supervision;with upper extremity assist PT Goal: Sit to Stand - Progress: Progressing toward goal Pt will Transfer Bed to Chair/Chair to Bed: with mod assist PT Transfer Goal: Bed to Chair/Chair to Bed - Progress: Goal set today  Visit Information  Last PT Received On: 02/13/13 PT/OT Co-Evaluation/Treatment: Yes    Subjective Data  Subjective: Why do I get so sick when I move.  Is it refulx?   Cognition  Cognition Arousal/Alertness: Awake/alert Behavior During Therapy: WFL for tasks assessed/performed Overall Cognitive Status: Impaired/Different from baseline Memory: Decreased short-term memory Following Commands: Follows one step commands with increased time Problem Solving: Slow processing;Decreased initiation;Difficulty sequencing;Requires verbal cues;Requires tactile cues General Comments: following commands and moving with increased time and facilitation needed for initiation    Balance  Static Sitting Balance Static Sitting - Balance Support: Bilateral upper extremity supported;Feet supported Static Sitting - Level of Assistance: 4: [name] assist;5: Stand by assistance Static Sitting - Comment/# of Minutes: sat edge of bed 8 minutes static with minguard to supervision.  Pt felt more secure with minguard  End of Session PT - End of Session Equipment Utilized During Treatment: Gait  belt;Oxygen Activity Tolerance: Patient limited by fatigue Patient left: in chair;with call bell/phone within reach Nurse Communication: Mobility status   GP     [facility] 02/13/2013, 10:57 AM Sheran Lawless, PT [phone] 02/13/2013

## 2013-02-13 NOTE — Progress Notes (Signed)
INITIAL NUTRITION ASSESSMENT  DOCUMENTATION CODES Per approved criteria  -Obesity Unspecified   INTERVENTION: 1. Ensure Complete po BID, each supplement provides 350 kcal and 13 grams of protein. 2. Ensure Pudding once daily, each supplement provides 170 kcal and 9 g of protein.  NUTRITION DIAGNOSIS: Inadequate oral intake related to stroke as evidenced by poor po intake.   Goal: Pt to meet >/= 90% of their estimated nutrition needs.  Monitor:  Weight, po intake, diet advancement, labs  Reason for Assessment: Consult  77 y.o. female  Admitting Dx: <principal problem not specified>  ASSESSMENT: 77 year old female presenting with right posterior cerebral artery distribution stroke with subsequent hemorrhage.   Pt reports that she has no appetite currently. She was unwilling to try any foods from her breakfast tray. She reports that her appetite had been ok before admission to the hospital. Pt agreed to try ensure plus to supplement her trays. Pt unwilling to open eyes to speak.  Height: Ht Readings from Last 1 Encounters:  02/06/13 5\' 3"  (1.6 m)    Weight: Wt Readings from Last 1 Encounters:  02/06/13 192 lb 3.9 oz (87.2 kg)    Ideal Body Weight: 52.4 kg  % Ideal Body Weight: 166%  Wt Readings from Last 10 Encounters:  02/06/13 192 lb 3.9 oz (87.2 kg)  03/17/12 201 lb (91.173 kg)    Usual Body Weight: unknown  % Usual Body Weight: unknown  BMI:  Body mass index is 34.06 kg/(m^2).  Estimated Nutritional Needs: Kcal: 2200-2500 Protein: 105-120 Fluid: 2.2-2.5 L  Skin: WNL  Diet Order: Dysphagia  EDUCATION NEEDS: -Education not appropriate at this time   Intake/Output Summary (Last 24 hours) at 02/13/13 0923 Last data filed at 02/13/13 0842  Gross per 24 hour  Intake   1240 ml  Output   1380 ml  Net   -140 ml    Last BM: none recorded   Labs:   Recent Labs Lab 02/10/13 0425 02/11/13 0549 02/13/13 0515  NA 134* 135 133*  K 3.6 3.2* 3.3*   CL 103 100 95*  CO2 22 27 32  BUN 24* 17 13  CREATININE 0.87 0.83 0.80  CALCIUM 8.9 8.4 8.4  GLUCOSE 115* 110* 92    CBG (last 3)  No results found for this basename: GLUCAP,  in the last 72 hours  Scheduled Meds: . antiseptic oral rinse  15 mL Mouth Rinse BID  . ciprofloxacin  400 mg Intravenous Q12H  . diazepam  2 mg Oral BID  . FLUoxetine  20 mg Oral Daily  . metoprolol tartrate  50 mg Oral BID  . pantoprazole  40 mg Oral QHS  . rOPINIRole  3 mg Oral QHS  . simvastatin  20 mg Oral q1800    Continuous Infusions: . sodium chloride 30 mL/hr at 02/12/13 1825  . niCARDipine Stopped (02/10/13 1000)    Past Medical History  Diagnosis Date  . Atrial fibrillation   . Diastolic heart failure   . Depression   . Anxiety disorder   . GERD (gastroesophageal reflux disease)   . Low back pain   . Hypertension   . RLS (restless legs syndrome)   . CVA (cerebral infarction)   . CHF (congestive heart failure)   . Anemia   . Dyspnea   . Hyponatremia   . Pleural effusion   . Coronary artery disease   . COPD (chronic obstructive pulmonary disease)   . Stroke 2012    left sided deficits  .  Bulging discs     receives epidurals for pain control    Past Surgical History  Procedure Laterality Date  . Carpal tunnel release    . Cystectomy    . Cataract extraction    . Appendectomy    . Abdominal hysterectomy      Ebbie Latus RD, LDN

## 2013-02-13 NOTE — Progress Notes (Signed)
Covering Clinical Social Worker (CSW) provided pt and pt sister Jamesetta So [phone] with bed offers. Both agreeable and have accepted placement at [facility] CSW contacted Elease Hashimoto at [facility] who informed CSW that after 20d ays in rehab pt primary insurance will cover 80% however pt secondary will only cover the remaining 20% for 30days. CSW made pt sister aware and will discuss with pt. Plan remains for pt to dc to [facility] when medically stable. Unit CSW to remain following.  Theresia Bough, MSW, Theresia Majors [phone]

## 2013-02-14 DIAGNOSIS — R519 Headache, unspecified: Secondary | ICD-10-CM | POA: Diagnosis not present

## 2013-02-14 DIAGNOSIS — I619 Nontraumatic intracerebral hemorrhage, unspecified: Secondary | ICD-10-CM | POA: Diagnosis not present

## 2013-02-14 DIAGNOSIS — I615 Nontraumatic intracerebral hemorrhage, intraventricular: Secondary | ICD-10-CM | POA: Diagnosis not present

## 2013-02-14 DIAGNOSIS — R51 Headache: Secondary | ICD-10-CM | POA: Diagnosis not present

## 2013-02-14 DIAGNOSIS — I63531 Cerebral infarction due to unspecified occlusion or stenosis of right posterior cerebral artery: Secondary | ICD-10-CM | POA: Diagnosis present

## 2013-02-14 MED ORDER — TOPIRAMATE 25 MG PO TABS
50.0000 mg | ORAL_TABLET | Freq: Two times a day (BID) | ORAL | Status: DC
  Administered 2013-02-14: 50 mg via ORAL
  Filled 2013-02-14 (×2): qty 2

## 2013-02-14 MED ORDER — TOPIRAMATE 50 MG PO TABS: 50.0000 mg | ORAL_TABLET | Freq: Two times a day (BID) | ORAL | Status: DC

## 2013-02-14 MED ORDER — DIAZEPAM 5 MG PO TABS: 2.5000 mg | ORAL_TABLET | Freq: Two times a day (BID) | ORAL | Status: DC

## 2013-02-14 MED ORDER — ENSURE PUDDING PO PUDG: 1.0000 | Freq: Three times a day (TID) | ORAL | Status: DC

## 2013-02-14 NOTE — Progress Notes (Signed)
Occupational Therapy Treatment Patient Details Name: Addalee Kavanagh MRN: [id number] DOB: 1935/12/30 Today's Date: 02/14/2013 Time: 8295-6213 OT Time Calculation (min): 38 min  OT Assessment / Plan / Recommendation Comments on Treatment Session Pt admitted with right PCA hemmorage.  Still with significant visual deficits including left visual field deficit.  Also continues to demonstrate decreased ability to tolerate movement and activity secondary to nausea and dizziness rated at 10/10 on given scale.  Pt will need extensive SNF rehab.  Currently requires total assist +2 for all functional transfers, bed mobility, and selfcare.    Follow Up Recommendations  SNF       Equipment Recommendations  3 in 1 bedside comode       Frequency Min 3X/week   Plan Discharge plan remains appropriate    Precautions / Restrictions Precautions Precautions: Fall Restrictions Weight Bearing Restrictions: No   Pertinent Vitals/Pain Pt with pain 10/10 in her head per her report    ADL  Eating/Feeding: Set up;Performed;Minimal assistance (pt drank from glass but needed min assist to balance.) Where Assessed - Eating/Feeding: Edge of bed Toilet Transfer: Simulated;+2 Total assistance Toilet Transfer: Patient Percentage: 40% Toilet Transfer Method: Sit to stand;Other (comment) (at EOB) Toileting - Clothing Manipulation and Hygiene: Simulated;+2 Total assistance Where Assessed - Toileting Clothing Manipulation and Hygiene: Sit to stand from 3-in-1 or toilet Transfers/Ambulation Related to ADLs: Pt needed total assist +2 (pt 40%) for standing and taking 1-2 small steps up the EOB for repositioning. ADL Comments: Pt still with complaints of dizziness, nausea, and headache.  No tape found on glasses from previous session.  Worked on visual scanning during session.  Pt unable to track superior or the left lateral field in the left eye or the right eye.  Demonstrates moderate left visual field deficit. Pt unable  to report any diplopia during session but when attempting to reach for her toothbrush at midline she was unable to do so, and kept overshooting to the right side.  It was also noted that she was having difficulty determining if there was a straw in her cup as well.  Pt transferred to EOB and sat with min assist for 10 mins during session.  Pt with LOB posteriorly in sitting statically.    OT Goals ADL Goals Pt Will Perform Grooming: with set-up;Sitting, chair;Sitting, edge of bed;Unsupported Pt Will Perform Upper Body Bathing: with set-up;Sitting, chair;Sitting, edge of bed;Unsupported Pt Will Perform Lower Body Bathing: with supervision;Sit to stand from chair;Sit to stand from bed Pt Will Transfer to Toilet: with supervision;Stand pivot transfer;3-in-1 ADL Goal: Toilet Transfer - Progress: Progressing toward goals Miscellaneous OT Goals Miscellaneous OT Goal #1: Pt will independently use compensatory strategies to locate 3/3 items on left side. OT Goal: Miscellaneous Goal #1 - Progress: Progressing toward goals Miscellaneous OT Goal #2: Pt will perform bed mobility at supervision level as precursor for EOB ADLs OT Goal: Miscellaneous Goal #2 - Progress: Progressing toward goals Miscellaneous OT Goal #3: Pt will perform static sitting task EOB >5 min at supervision level as precursor for EOB ADLs. OT Goal: Miscellaneous Goal #3 - Progress: Progressing toward goals  Visit Information  Last OT Received On: 02/14/13 Assistance Needed: +1    Subjective Data  Subjective: I'm so nauseas and my head hurts.  Can I have something for pain? Patient Stated Goal: Wants to stop being so nauseas.      Cognition  Cognition Arousal/Alertness: Awake/alert Behavior During Therapy: Anxious Overall Cognitive Status: Impaired/Different from baseline Area of Impairment: Attention;Memory;Awareness  Current Attention Level: Focused Following Commands: Follows one step commands with increased  time Safety/Judgement: Decreased awareness of deficits Problem Solving: Slow processing    Mobility  Bed Mobility Bed Mobility: Supine to Sit;Sit to Supine;Sitting - Scoot to Edge of Bed Supine to Sit: HOB elevated;2: Max assist Sitting - Scoot to [place] of Bed: 2: Max assist Sit to Supine: 1: +2 Total assist Sit to Supine: Patient Percentage: 30% Details for Bed Mobility Assistance: Pt resistant to bringing trunk down on the left side and bringing LEs into the bed.   Transfers Transfers: Sit to Stand Sit to Stand: 1: +2 Total assist;From bed;Without upper extremity assist Sit to Stand: Patient Percentage: 40% Stand to Sit: 1: +2 Total assist;To bed Stand to Sit: Patient Percentage: 40% Details for Transfer Assistance: Pt would not attempt pushing up from the bed with the UEs but instead held onto therapist's arm and nurses arm.  Pt with increased fear of falling with standing.       Balance Balance Balance Assessed: Yes Static Sitting Balance Static Sitting - Balance Support: Bilateral upper extremity supported Static Sitting - Level of Assistance: 4: Min assist Static Sitting - Comment/# of Minutes: Pt sat EOB for 8 mins while engaged in drinking task.  Static Standing Balance Static Standing - Balance Support: Right upper extremity supported;Left upper extremity supported Static Standing - Level of Assistance: 1: +2 Total assist;Other (comment);Patient percentage (comment) (Pt required total assist +2 (pt 40%) for standing.)   End of Session OT - End of Session Activity Tolerance: Patient limited by pain;Other (comment) (limited by nausea and dizziness) Patient left: in bed;with call bell/phone within reach Nurse Communication: Mobility status     Aadhya Bustamante OTR/L Pager number [phone] 02/14/2013, 2:58 PM

## 2013-02-14 NOTE — Clinical Social Work Note (Signed)
Clinical Social Worker facilitated patient discharge including contacting patient family and facility to confirm patient discharge plans.  Clinical information faxed to facility and family agreeable with plan.  CSW arranged ambulance transport via [facility] to [facility].  RN to call report prior to discharge.  Clinical Social Worker will sign off for now as social work intervention is no longer needed. Please consult us again if new need arises.  Jesse Phylis Javed, LCSW [phone] 

## 2013-02-14 NOTE — Discharge Summary (Signed)
Stroke Discharge Summary  Patient ID: Tracey [name]   MRN: [id number]      DOB: October 28, 1935  Date of Admission: 02/06/2013 Date of Discharge: 02/14/2013  Attending Physician:  Darcella Cheshire, MD, Stroke MD  Consulting Physician(s):   Treatment Team:  Md Stroke, MD  Patient's PCP:  Ignatius Specking., MD  Discharge Diagnoses:  Principal Problem:   Acute right PCA embolic stroke due to atrial fibrillation with subsequent right PCA territory hemorrhage with intraventricular extension Active Problems:   right vertebral artery occlusion   Atrial fibrillation   CAD in native artery   Hypertension, acclereated   Cerebral hemorrhage   Intracerebral hemorrhage, intraventricular   Headache(784.0)   Obesity, Body mass index is 34.06 kg/(m^2).    Fluid overload, resolved   Increased bladder residuals .   Leukocytosis  Past Medical History  Diagnosis Date  . Atrial fibrillation   . Diastolic heart failure   . Depression   . Anxiety disorder   . GERD (gastroesophageal reflux disease)   . Low back pain   . Hypertension   . RLS (restless legs syndrome)   . CVA (cerebral infarction)   . CHF (congestive heart failure)   . Anemia   . Dyspnea   . Hyponatremia   . Pleural effusion   . Coronary artery disease   . COPD (chronic obstructive pulmonary disease)   . Stroke 2012    left sided deficits  . Bulging discs     receives epidurals for pain control   Past Surgical History  Procedure Laterality Date  . Carpal tunnel release    . Cystectomy    . Cataract extraction    . Appendectomy    . Abdominal hysterectomy        Medication List    TAKE these medications       acetaminophen 650 MG CR tablet  Commonly known as:  TYLENOL  Take 650 mg by mouth every 8 (eight) hours.     calcium-vitamin D 500-200 MG-UNIT per tablet  Take 1 tablet by mouth daily.     diazepam 5 MG tablet  Commonly known as:  VALIUM  Take 0.5 tablets (2.5 mg total) by mouth 2 (two) times daily.      feeding supplement Pudg  Take 1 Container by mouth 3 (three) times daily between meals.     ferrous sulfate 325 (65 FE) MG EC tablet  Take 325 mg by mouth daily with breakfast.     FLUoxetine 20 MG tablet  Commonly known as:  PROZAC  Take 20 mg by mouth daily.     HYDROcodone-acetaminophen 5-325 MG per tablet  Commonly known as:  NORCO/VICODIN  Take 1 tablet by mouth every 6 (six) hours as needed for pain.     metoprolol 100 MG tablet  Commonly known as:  LOPRESSOR  Take 100 mg by mouth daily.     pantoprazole 40 MG tablet  Commonly known as:  PROTONIX  Take 40 mg by mouth daily.     polyethylene glycol packet  Commonly known as:  MIRALAX / GLYCOLAX  Take 17 g by mouth daily.     pravastatin 40 MG tablet  Commonly known as:  PRAVACHOL  Take 40 mg by mouth daily.     rOPINIRole 3 MG tablet  Commonly known as:  REQUIP  Take 3 mg by mouth at bedtime.     topiramate 50 MG tablet  Commonly known as:  TOPAMAX  Take 1 tablet (  50 mg total) by mouth 2 (two) times daily.        LABORATORY STUDIES CBC    Component Value Date/Time   WBC 11.8* 02/13/2013 0515   RBC 3.56* 02/13/2013 0515   HGB 10.4* 02/13/2013 0515   HCT 32.2* 02/13/2013 0515   PLT 113* 02/13/2013 0515   MCV 90.4 02/13/2013 0515   MCH 29.2 02/13/2013 0515   MCHC 32.3 02/13/2013 0515   RDW 13.5 02/13/2013 0515   LYMPHSABS 0.8 02/09/2013 0425   MONOABS 1.2* 02/09/2013 0425   EOSABS 0.0 02/09/2013 0425   BASOSABS 0.0 02/09/2013 0425   CMP    Component Value Date/Time   NA 133* 02/13/2013 0515   K 3.3* 02/13/2013 0515   CL 95* 02/13/2013 0515   CO2 32 02/13/2013 0515   GLUCOSE 92 02/13/2013 0515   BUN 13 02/13/2013 0515   CREATININE 0.80 02/13/2013 0515   CALCIUM 8.4 02/13/2013 0515   PROT 8.2 02/06/2013 0637   ALBUMIN 4.1 02/06/2013 0637   AST 24 02/06/2013 0637   ALT 17 02/06/2013 0637   ALKPHOS 102 02/06/2013 0637   BILITOT 0.7 02/06/2013 0637   GFRNONAA 71* 02/13/2013 0515   GFRAA 82* 02/13/2013 0515    COAGS Lab Results  Component Value Date   INR 1.06 02/06/2013   Lipid Panel    Component Value Date/Time   CHOL 163 02/08/2013 0515   TRIG 62 02/08/2013 0515   HDL 68 02/08/2013 0515   CHOLHDL 2.4 02/08/2013 0515   VLDL 12 02/08/2013 0515   LDLCALC 83 02/08/2013 0515   HgbA1C  No results found for this basename: HGBA1C   Cardiac Panel (last 3 results) No results found for this basename: CKTOTAL, CKMB, TROPONINI, RELINDX,  in the last 72 hours Urinalysis    Component Value Date/Time   COLORURINE YELLOW 02/08/2013 0800   APPEARANCEUR CLEAR 02/08/2013 0800   LABSPEC 1.017 02/11/2013 1634   PHURINE 5.5 02/11/2013 1634   GLUCOSEU NEGATIVE 02/11/2013 1634   HGBUR NEGATIVE 02/11/2013 1634   BILIRUBINUR NEGATIVE 02/11/2013 1634   KETONESUR NEGATIVE 02/11/2013 1634   PROTEINUR NEGATIVE 02/11/2013 1634   UROBILINOGEN 0.2 02/11/2013 1634   NITRITE NEGATIVE 02/11/2013 1634   LEUKOCYTESUR NEGATIVE 02/11/2013 1634   Urine Drug Screen  No results found for this basename: labopia,  cocainscrnur,  labbenz,  amphetmu,  thcu,  labbarb    Alcohol Level No results found for this basename: eth    SIGNIFICANT DIAGNOSTIC STUDIES CT of the brain  02/13/2013 No significant change relative to the hemorrhagic infarction in the right PCA territory. No additional bleeding or mass effect. Diminishing intraventricular hyperdense blood.  02/09/2013 Hemorrhagic infarction right PCA territory is stable. Intraventricular hemorrhage also stable. No new findings.  02/08/2013 1. Evolving hemorrhagic infarct in the right PCA territory as described. There is slightly increased local mass effect with increased intraventricular hemorrhage. 2. Trace subarachnoid hemorrhage along the left parietal convexity.  02/06/2013 1. Large cerebral hemorrhage centered in the right posterior temporal and occipital regions with surrounding vasogenic edema and a small amount of local mass effect. There is intraventricular hemorrhage within the  right lateral ventricle at this time. No associated hydrocephalus. 2. Mild cerebral and cerebellar atrophy  MRI of the brain 02/06/2013 Large hemorrhagic right PCA territory infarct. Intraventricular extension. No significant midline shift. Chronic left PCA territory infarct. Mild atrophy and chronic microvascular ischemic change.  MRA of the brain 02/06/2013 Focal occlusion ambient P2 segment right PCA of uncertain etiology. Right vertebral occlusion. This  vessel was patent in 2011; it is unclear if this occurred acutely, contributing to the patient's acute infarction. 50-75% stenosis distal left vertebral. Intracranial atherosclerotic disease elsewhere as described.  2D Echocardiogram Left ventricle: The cavity size was normal. Systolic function was normal. The estimated ejection fraction was in the range of 60% to 65%. Wall motion was normal; there were no regional wall motion abnormalities. - Mitral valve: Mild regurgitation. - Left atrium: The atrium was mildly dilated. - Atrial septum: No defect or patent foramen ovale was identified. Carotid Doppler Bilateral: Less than 39% ICA stenosis. Vertebral artery flow is antegrade.  CXR 02/08/2013 No acute abnormality noted.  EKG ATRIAL FIBRILLATION, PROBABLE LVH WITH SECONDARY REPOL ABNRM  EEG that shows diffuse slowing, no seizure activity.      History of Present Illness   Tracey [name] is a 77 y.o. female who was in her normal state of health until 9pm last night 02/05/2013. At that time, she noticed vision changes and headache. This persisted and then this morning 02/06/2013, she complained of some numbness and therefore her husband called 911. Patient was not a TPA candidate secondary to delay in arrival and presence of ICH. She was admitted to the neuro ICU 3100 for further evaluation and treatment  Hospital Course MRI confirmed a right posterior cerebral artery distribution infarct, embolic secondary to known atrial fibrillation with subsequent right  PCA territory hemorrhage with intraventricular extension. She has headaches as a result. Was placed on Topamax for headache control. In the hospital she received dilaudid with good results, but made her very lethargic  The patient has a history of atrial fibrillation, she has not been on any antiplatelet medications or anticoagulant medications for over one year secondary to an upper GI bleed from peptic ulcer disease that required blood transfusions.   Patient also with accelerated Hypertension, BP 169/112. She was initially managed on Cardene drip. He was weaned what she was started on lopressor 50 q12. Her blood pressure is continuing to trend down.  EEG was completed in order to rule out occipital seizures as patient had difficulty with visual fields bilaterally at first. Exam has now become more focused with only left homonymous hemianopsia.  Increased bladder residuals s/p multiple straight cath. U Cx neg 6/18. Was initially placed on cipro and was discontinued.  Patient with continued stroke symptoms of left hemiparesis, left homonymous hemianopsia. Physical therapy, occupational therapy and speech therapy evaluated patient. They recommend SNF placement. It has been obtained and patient for discharge.  Discharge Exam  Blood pressure 157/67, pulse 92, temperature 98.1 F (36.7 C), temperature source Oral, resp. rate 24, height 5\' 3"  (1.6 m), weight 87.2 kg (192 lb 3.9 oz), SpO2 96.00%.  General: The patient is awake, in mild distress due to headache. Will follow commands. The patient is moderately obese.  Respiratory: Lung fields are clear  Abdomen: Abdomen is moderately obese, positive bowel sounds, no tenderness  Skin: No significant peripheral edema is noted.  Cardiac: atrial fibrillation  Neurologic Exam  Cranial nerves: Facial symmetry is present. Speech is normal, no aphasia or dysarthria is noted. Extraocular movements intact and visual fields with left HH.  Motor: The patient has  the ability to move all 4 extremities, mild drift with the left upper extremity is seen.  Coordination: The patient has good finger-nose-finger  Gait and station: The gait was not tested. No drift is seen.  Reflexes: Deep tendon reflexes are symmetric   Discharge Diet   Dysphagia thin liquids  Discharge  Plan    Disposition:  skilled nursing facility for ongoing care and rehab   Ongoing risk factor control by Primary Care Physician. Risk factor recommendations:  Hypertension target range 130-140/70-80 Weight loss    Follow-up MD at SNF  Follow-up VYAS,DHRUV B., MD in 1 month.  Follow-up with Dr. Delia Heady, Stroke Clinic in 2 months.  30 minutes were spent preparing discharge.  Signed Annie Main, AVNP, ANP-BC, [facility] Nurse Practitioner 02/14/2013, 1:41 PM  I have personally examined this patient, reviewed pertinent data and developed the plan of care. I agree with above.  Suanne Marker, MD 02/14/2013, 7:35 PM Certified in Neurology, Neurophysiology and Neuroimaging [facility] - Stroke Team  Please refer to [email or web address] for on-call Stroke MD

## 2013-02-14 NOTE — Progress Notes (Signed)
Stroke Team Progress Note  HISTORY [name] is a 77 y.o. female who was in her normal state of health until 9pm last night 02/05/2013. At that time, she noticed vision changes and headache. This persisted and then this morning 02/06/2013, she complained of some numbness and therefore her husband called 911.   Patient was not a TPA candidate secondary to ICH. She was admitted to the neuro ICU  3100 for further evaluation and treatment.  SUBJECTIVE Patient still complains of headache. Hx of migraines per her.  OBJECTIVE Most recent Vital Signs: Filed Vitals:   02/14/13 0600 02/14/13 0700 02/14/13 0800 02/14/13 0900  BP: 166/90 174/84 186/72 169/65  Pulse: 79 74 74 79  Temp:      TempSrc:      Resp: 15 16 17 17   Height:      Weight:      SpO2: 99% 100% 100% 98%   CBG (last 3)  No results found for this basename: GLUCAP,  in the last 72 hours  IV Fluid Intake:      MEDICATIONS  . antiseptic oral rinse  15 mL Mouth Rinse BID  . diazepam  2 mg Oral BID  . feeding supplement  237 mL Oral Once  . feeding supplement  1 Container Oral TID BM  . FLUoxetine  20 mg Oral Daily  . metoprolol tartrate  50 mg Oral BID  . pantoprazole  40 mg Oral QHS  . rOPINIRole  3 mg Oral QHS  . simvastatin  20 mg Oral q1800   PRN:  acetaminophen, acetaminophen, HYDROmorphone (DILAUDID) injection, labetalol, ondansetron (ZOFRAN) IV, polyethylene glycol, traMADol  Diet:  Dysphagia 3 thin liquids Activity:  Up in chair DVT Prophylaxis:  SCD  CLINICALLY SIGNIFICANT STUDIES Basic Metabolic Panel:   Recent Labs Lab 02/11/13 0549 02/13/13 0515  NA 135 133*  K 3.2* 3.3*  CL 100 95*  CO2 27 32  GLUCOSE 110* 92  BUN 17 13  CREATININE 0.83 0.80  CALCIUM 8.4 8.4   Liver Function Tests:  No results found for this basename: AST, ALT, ALKPHOS, BILITOT, PROT, ALBUMIN,  in the last 168 hours CBC:   Recent Labs Lab 02/08/13 0515 02/09/13 0425  02/11/13 0549 02/13/13 0515  WBC 17.9* 12.8*  <  > 11.9* 11.8*  NEUTROABS 15.3* 10.8*  --   --   --   HGB 13.2 12.0  < > 11.0* 10.4*  HCT 39.7 37.2  < > 33.3* 32.2*  MCV 92.1 92.8  < > 90.2 90.4  PLT 122* 102*  < > 95* 113*  < > = values in this interval not displayed. Coagulation:  No results found for this basename: LABPROT, INR,  in the last 168 hours Cardiac Enzymes:  No results found for this basename: CKTOTAL, CKMB, CKMBINDEX, TROPONINI,  in the last 168 hours Urinalysis:   Recent Labs Lab 02/08/13 0800 02/11/13 1634  COLORURINE YELLOW  --   LABSPEC 1.020 1.017  PHURINE 6.0 5.5  GLUCOSEU NEGATIVE NEGATIVE  HGBUR NEGATIVE NEGATIVE  BILIRUBINUR NEGATIVE NEGATIVE  KETONESUR NEGATIVE NEGATIVE  PROTEINUR 100* NEGATIVE  UROBILINOGEN 0.2 0.2  NITRITE NEGATIVE NEGATIVE  LEUKOCYTESUR NEGATIVE NEGATIVE   Lipid Panel    Component Value Date/Time   CHOL 163 02/08/2013 0515   HgbA1C  No results found for this basename: HGBA1C    Urine Drug Screen:   No results found for this basename: labopia,  cocainscrnur,  labbenz,  amphetmu,  thcu,  labbarb    Alcohol  Level: No results found for this basename: ETH,  in the last 168 hours   CT of the brain 02/13/2013  No significant change relative to the hemorrhagic infarction in the right PCA territory.  No additional bleeding or mass effect. Diminishing intraventricular hyperdense blood.   02/09/2013 Hemorrhagic infarction right PCA territory is stable. Intraventricular hemorrhage also stable. No new findings. 02/08/2013   1. Evolving hemorrhagic infarct in the right PCA territory as described. There is slightly increased local mass effect with increased intraventricular hemorrhage. 2. Trace subarachnoid hemorrhage along the left parietal convexity. 02/06/2013 1. Large cerebral hemorrhage centered in the right posterior temporal and occipital regions with surrounding vasogenic edema and a small amount of local mass effect. There is intraventricular hemorrhage within the right lateral  ventricle at this time. No associated hydrocephalus.  2. Mild cerebral and cerebellar atrophy  MRI of the brain  02/06/2013 Large hemorrhagic right PCA territory infarct. Intraventricular extension. No significant midline shift. Chronic left PCA territory infarct. Mild atrophy and chronic microvascular ischemic change.  MRA of the brain  02/06/2013 Focal occlusion ambient P2 segment right PCA of uncertain etiology. Right vertebral occlusion. This vessel was patent in 2011; it is  unclear if this occurred acutely, contributing to the patient's acute infarction.  50-75% stenosis distal left vertebral. Intracranial atherosclerotic disease elsewhere as described.  2D Echocardiogram  Left ventricle: The cavity size was normal. Systolic function was normal. The estimated ejection fraction was in the range of 60% to 65%. Wall motion was normal; there were no regional wall motion abnormalities. - Mitral valve: Mild regurgitation. - Left atrium: The atrium was mildly dilated. - Atrial  septum: No defect or patent foramen ovale was identified.  Carotid Doppler  Bilateral: Less than 39% ICA stenosis. Vertebral artery flow is antegrade.   CXR  02/08/2013 No acute abnormality noted.  EKG  ATRIAL FIBRILLATION, PROBABLE LVH WITH SECONDARY REPOL ABNRM   EEG that shows diffuse slowing, no seizure activity.   Therapy Recommendations SNF  Physical Exam  General: The patient is awake, in mild distress due to headache. Will follow commands. The patient is moderately obese.  Respiratory: Lung fields are clear  Abdomen: Abdomen is moderately obese, positive bowel sounds, no tenderness  Skin: No significant peripheral edema is noted.  Cardiac:  atrial fibrillation   Neurologic Exam  Cranial nerves: Facial symmetry is present. Speech is normal, no aphasia or dysarthria is noted. Extraocular movements intact and visual fields with left HOMONYMOUS HEMIANOPSIA.  Motor: The patient has the ability to move all 4  extremities, mild drift with the left upper extremity is seen.  Coordination: The patient has good finger-nose-finger  Gait and station: The gait was not tested. No drift is seen.  Reflexes: Deep tendon reflexes are symmetric.  ASSESSMENT Tracey [name] is a 77 y.o. female presenting with right posterior cerebral artery distribution infarct with subsequent right PCA territory hemorrhage with intraventricular extension. The patient has a history of atrial fibrillation, she has not been on any antiplatelet medications or anticoagulant medications for over one year secondary to an upper GI bleed from peptic ulcer disease that required blood transfusions. 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 The patient likely had an embolic stroke involving the right posterior cerebral artery, with subsequent hemorrhage. Hemorrhage remains stable per CT this am.    Headaches, received dilaudid through the night. Now lethargic  with dysarthria.   Atrial fibrillation  History of peptic ulcer disease, upper GI bleed  Depression and anxiety  Gastroesophageal reflux disease  accelerated Hypertension, BP 169/112. SBP goal <180,  on lopressor 50 q12   thrombocytopenia  Congestive heart failure  Coronary artery disease  COPD  Obesity  Restless leg syndrome  Leukocytosis, trending down 11.8 today  Increased bladder residuals s/p multiple straight cath. U Cx neg 6/18. On cipro.  Fluid overload   Poor po intake,  Obesity, Body mass index is 34.06 kg/(m^2).   Hospital day # 8  TREATMENT/PLAN  No anti-coagulation or antiplatelet   Add topamax for headaches. D/c dilaudid. Use ultram prn. Hold valium for lethargy.  SNF planned at discharge. Bed available to today. Ok for discharge there.  Annie Main, MSN, RN, ANVP-BC, ANP-BC, [facility] Pager: [phone] 02/14/2013 9:07 AM  I have personally obtained a history, examined the patient, evaluated imaging results, and formulated the assessment and plan of care. I agree with the above. Transfer to snf.  Suanne Marker, MD 02/14/2013, 9:07 AM Certified in Neurology, Neurophysiology and Neuroimaging [facility] - Stroke Team  Please refer to [email or web address] for on-call Stroke MD

## 2013-02-14 NOTE — Progress Notes (Signed)
Report called to [place] at [facility] at [phone].

## 2013-02-14 NOTE — Progress Notes (Signed)
Called NP, ordered to give skipped Valium dose and repeat labetelol. PTAR to bedside. Meds given BP trending down, discussed with PTAR.

## 2013-05-25 ENCOUNTER — Encounter: Payer: Self-pay | Admitting: Neurology

## 2013-05-25 ENCOUNTER — Ambulatory Visit (INDEPENDENT_AMBULATORY_CARE_PROVIDER_SITE_OTHER): Payer: Medicare Other | Admitting: Neurology

## 2013-05-25 VITALS — BP 189/75 | HR 58 | Temp 98.0°F | Ht 64.25 in | Wt 185.0 lb

## 2013-05-25 DIAGNOSIS — H53462 Homonymous bilateral field defects, left side: Secondary | ICD-10-CM

## 2013-05-25 DIAGNOSIS — I4891 Unspecified atrial fibrillation: Secondary | ICD-10-CM

## 2013-05-25 DIAGNOSIS — I63532 Cerebral infarction due to unspecified occlusion or stenosis of left posterior cerebral artery: Secondary | ICD-10-CM

## 2013-05-25 DIAGNOSIS — I6350 Cerebral infarction due to unspecified occlusion or stenosis of unspecified cerebral artery: Secondary | ICD-10-CM

## 2013-05-25 DIAGNOSIS — H53469 Homonymous bilateral field defects, unspecified side: Secondary | ICD-10-CM

## 2013-05-25 MED ORDER — ASPIRIN 81 MG PO TBEC: 81.0000 mg | DELAYED_RELEASE_TABLET | Freq: Every day | ORAL | Status: AC

## 2013-05-25 NOTE — Progress Notes (Signed)
[facility] [address] [place] [address] [phone]       OFFICE FOLLOW-UP NOTE  [name] Date of Birth:  Sep 19, 1935 Medical Record Number:  [id number]   HPI:  47 year Lady see seen for the office following hospital admission on 02/06/13  for stroke. She developed headache and visual changes night prior to admission. Next patient noticed some numbness as well and corners were call 911. She'll not t-PA candidate due to delay in arrival. CT scan of the head showed hemorrhagic right posterior cerebral artery infarct with extension of hemorrhage into the ventricles but without hydrocephalus. She was treated for headache with Topamax 100 pressures with a controlled. She remained stable. She had history of atrial fibrillation but had not been on any antiplatelet agents for prior 1 years secondary to upper GI bleed from peptic ulcer that required blood transfusions. She was found to x-ray to hypertension and was initially started on Cardene drip and subsequently switched to Lopressor. EEG was done to rule out occipital seizures but was negative. On exam she was found to have dense left homonymous notes she was transferred to skilled nursing facility for ongoing care and rehabilitation. She has done well since discharge and has now currently living at home. Headaches are much better though she still has them. A left-sided peripheral vision has not recovered. She still needs my help with her activities of daily living. She had severe left shoulder pain and require cortisone injections by Dr. Dutch Quint. She is walking with a walker and is reasonably safe and has had no recent falls. She had lipid profile checked on 05/13/13 with total cholesterol 178 by Dr. Sherril Croon her primary physician. She has finished outpatient therapies.  ROS:   14 system review of systems is positive for  memory difficulties, and and balance difficulties.  PMH:  Past Medical History  Diagnosis Date  .  Atrial fibrillation   . Diastolic heart failure   . Depression   . Anxiety disorder   . GERD (gastroesophageal reflux disease)   . Low back pain   . Hypertension   . RLS (restless legs syndrome)   . CVA (cerebral infarction)   . CHF (congestive heart failure)   . Anemia   . Dyspnea   . Hyponatremia   . Pleural effusion   . Coronary artery disease   . COPD (chronic obstructive pulmonary disease)   . Stroke 2012    left sided deficits  . Bulging discs     receives epidurals for pain control    Social History:  History   Social History  . Marital Status: Widowed    Spouse Name: N/A    Number of Children: 2  . Years of Education: 10   Occupational History  . disabled    Social History Main Topics  . Smoking status: Never Smoker   . Smokeless tobacco: Not on file  . Alcohol Use: No  . Drug Use: No  . Sexual Activity: Not on file   Other Topics Concern  . Not on file   Social History Narrative  . No narrative on file    Medications:   Current Outpatient Prescriptions on File Prior to Visit  Medication Sig Dispense Refill  . acetaminophen (TYLENOL) 650 MG CR tablet Take 650 mg by mouth every 8 (eight) hours.      . Calcium Carbonate-Vitamin D (CALCIUM-VITAMIN D) 500-200 MG-UNIT per tablet Take 1 tablet by mouth daily.       Marland Kitchen  diazepam (VALIUM) 5 MG tablet Take 0.5 tablets (2.5 mg total) by mouth 2 (two) times daily.  30 tablet  0  . feeding supplement (ENSURE) PUDG Take 1 Container by mouth 3 (three) times daily between meals.  30 Can  2  . ferrous sulfate 325 (65 FE) MG EC tablet Take 325 mg by mouth daily with breakfast.      . FLUoxetine (PROZAC) 20 MG tablet Take 20 mg by mouth daily.      Marland Kitchen HYDROcodone-acetaminophen (NORCO) 5-325 MG per tablet Take 1 tablet by mouth every 6 (six) hours as needed for pain.       . metoprolol (LOPRESSOR) 100 MG tablet Take 100 mg by mouth daily.      . pantoprazole (PROTONIX) 40 MG tablet Take 40 mg by mouth daily.      .  polyethylene glycol (MIRALAX / GLYCOLAX) packet Take 17 g by mouth daily.      . pravastatin (PRAVACHOL) 40 MG tablet Take 40 mg by mouth daily.      Marland Kitchen rOPINIRole (REQUIP) 3 MG tablet Take 3 mg by mouth at bedtime.      . topiramate (TOPAMAX) 50 MG tablet Take 1 tablet (50 mg total) by mouth 2 (two) times daily.  60 tablet  2   No current facility-administered medications on file prior to visit.    Allergies:   Allergies  Allergen Reactions  . Benadryl [Diphenhydramine] Rash    Physical Exam General: well developed, well nourished, seated, in no evident distress Head: head normocephalic and atraumatic. Orohparynx benign Neck: supple with no carotid or supraclavicular bruits Cardiovascular: regular rate and rhythm, no murmurs Musculoskeletal: no deformity Skin:  no rash/petichiae Vascular:  Normal pulses all extremities Filed Vitals:   05/25/13 1405  BP: 189/75  Pulse: 58  Temp: 98 F (36.7 C)    Neurologic Exam Mental Status: Awake and fully alert. Oriented to place and time. Recent and very slightly diminished. Recall 2/3. and remote memory intact. Attention span, concentration and fund of knowledge appropriate. Mood and affect appropriate.  Cranial Nerves: Fundoscopic exam reveals sharp disc margins. Pupils equal, briskly reactive to light. Extraocular movements full without nystagmus. Mild restriction of vertical upgaze Visual fields dense left hemianopsia to confrontation. Hearing intact. Facial sensation intact. Face, tongue, palate moves normally and symmetrically.  Motor: Normal bulk and tone. Normal strength in all tested extremity muscles. Diminished fine finger was in the left. Orbits right over left upper extremity. His increase in the left leg. Sensory.: intact to touch and pinprick and vibratory sensation.  Coordination: Rapid alternating movements normal in all extremities. Finger-to-nose and heel-to-shin performed accurately bilaterally. Gait and Station: Arises  from chair without difficulty. Stance is normal. Gait uses a walker demonstrates normal stride length and  Mild imbalance .   Reflexes: 1+ and symmetric. Toes downgoing.   NIHSS  2 Modified Rankin  2   ASSESSMENT: 49 year lady with right posterior cerebral embolic infart with hemorrhagic transformation with slight intraventricular extension a June 2014 secondary to atrial fibrillation. Vascular risk factors of atrial fibrillation, hypertension and hyperlipidemia    PLAN: Start enteric coated 81 mg aspirin daily for stroke prevention. She has atrial fibrillation but is at high risk for bleeding given history of bleeding peptic ulcer in the past and recent intracerebral hemorrhage. Strict control of hypertension with blood pressure goal below 130/90 and lipids with LDL cholesterol goal below 100 mg percent. Check CT head without .Return for followup in 3 months with  Larita Fife, NP in 3 months.

## 2013-05-25 NOTE — Patient Instructions (Addendum)
Start enteric coated 81 mg aspirin daily for stroke prevention. She has atrial fibrillation but is at high risk for bleeding given history of bleeding peptic ulcer in the past and recent intracerebral hemorrhage. Strict control of hypertension with blood pressure goal below 130/90 and lipids with LDL cholesterol goal below 100 mg percent. Check CT head without .Return for followup in 3 months with Tracey Fife, NP in 3 months.

## 2013-06-20 ENCOUNTER — Encounter: Payer: Self-pay | Admitting: Neurology

## 2013-08-29 ENCOUNTER — Encounter (INDEPENDENT_AMBULATORY_CARE_PROVIDER_SITE_OTHER): Payer: Self-pay

## 2013-08-29 ENCOUNTER — Ambulatory Visit (INDEPENDENT_AMBULATORY_CARE_PROVIDER_SITE_OTHER): Payer: Medicare Other | Admitting: Nurse Practitioner

## 2013-08-29 ENCOUNTER — Encounter: Payer: Self-pay | Admitting: Nurse Practitioner

## 2013-08-29 VITALS — BP 170/78 | HR 60 | Temp 97.0°F | Ht 64.0 in | Wt 208.0 lb

## 2013-08-29 DIAGNOSIS — H53462 Homonymous bilateral field defects, left side: Secondary | ICD-10-CM

## 2013-08-29 DIAGNOSIS — G2581 Restless legs syndrome: Secondary | ICD-10-CM

## 2013-08-29 DIAGNOSIS — I6350 Cerebral infarction due to unspecified occlusion or stenosis of unspecified cerebral artery: Secondary | ICD-10-CM

## 2013-08-29 DIAGNOSIS — I4891 Unspecified atrial fibrillation: Secondary | ICD-10-CM

## 2013-08-29 DIAGNOSIS — I63532 Cerebral infarction due to unspecified occlusion or stenosis of left posterior cerebral artery: Secondary | ICD-10-CM

## 2013-08-29 DIAGNOSIS — H53469 Homonymous bilateral field defects, unspecified side: Secondary | ICD-10-CM

## 2013-08-29 NOTE — Progress Notes (Deleted)
Subjective:    Patient ID: Tracey [name] is a 78 y.o. female.  HPI {Common ambulatory SmartLinks:[id number]}  Review of Systems  Constitutional: Negative.   HENT: Positive for rhinorrhea.   Eyes: Negative.   Respiratory: Negative.   Cardiovascular: Positive for leg swelling.  Gastrointestinal: Negative.   Endocrine: Negative.   Genitourinary: Negative.   Musculoskeletal: Negative.   Skin: Negative.   Neurological:       Restless leg  Hematological: Negative.   Psychiatric/Behavioral: Negative.     Objective:  Neurologic Exam  Physical Exam  Assessment:   ***  Plan:   ***

## 2013-08-29 NOTE — Patient Instructions (Addendum)
PLAN:  Continue coated 81 mg aspirin daily for stroke prevention. She has atrial fibrillation but is at high risk for bleeding given history of bleeding peptic ulcer in the past and recent intracerebral hemorrhage. Strict control of hypertension with blood pressure goal below 130/90 and lipids with LDL cholesterol goal below 100 mg percent.  Return for followup in 6 months with Tracey Hawking, NP.

## 2013-08-29 NOTE — Progress Notes (Signed)
PATIENT: Tracey [name] DOB: 1936-01-10   REASON FOR VISIT: follow up for stroke HISTORY FROM: patient  HISTORY OF PRESENT ILLNESS: 42 year Lady see seen for the office following hospital admission on 02/06/13 for stroke. She developed headache and visual changes night prior to admission. Next patient noticed some numbness as well and corners were call 911. She was not t-PA candidate due to delay in arrival. CT scan of the head showed hemorrhagic right posterior cerebral artery infarct with extension of hemorrhage into the ventricles but without hydrocephalus. She was treated for headache with Topamax 100 pressures with a controlled. She remained stable. She had history of atrial fibrillation but had not been on any antiplatelet agents for prior 1 years secondary to upper GI bleed from peptic ulcer that required blood transfusions. She was found to have hypertension and was initially started on Cardene drip and subsequently switched to Lopressor. EEG was done to rule out occipital seizures but was negative. On exam she was found to have dense left homonymous notes she was transferred to skilled nursing facility for ongoing care and rehabilitation. She has done well since discharge and has now currently living at home. Headaches are much better though she still has them. A left-sided peripheral vision has not recovered. She still needs help with her activities of daily living. She had severe left shoulder pain and require cortisone injections by Dr. Trenton Gammon. She is walking with a walker and is reasonably safe and has had no recent falls. She had lipid profile checked on 05/13/13 with total cholesterol 178 by Dr. Woody Seller her primary physician. She has finished outpatient therapies.   UPDATE 08/29/13 (LL):  Patient comes to office for stroke revisit. Since last visit she was diagnosed with breast cancer and had a lumpectomy. She will not require chemotherapy or radiation. BP is elevated in office today, at  170/78, but they state that it usually runs 341-937'T systolic.  She's continued to get stronger since the stroke, but still must ambulate with rolling walker and needs assistance with activities of daily living. She is not had any falls. She recently had trouble with fluid retention and is now taking Lasix with potassium replacement. She states she feels well and her mood is good, and she has no new complaints.  ROS:  14 system review of systems is positive for restless legs, leg swelling, runny nose.Marland Kitchen  ALLERGIES: Allergies  Allergen Reactions  . Benadryl [Diphenhydramine] Rash    HOME MEDICATIONS: Outpatient Prescriptions Prior to Visit  Medication Sig Dispense Refill  . acetaminophen (TYLENOL) 650 MG CR tablet Take 650 mg by mouth every 8 (eight) hours.      Marland Kitchen aspirin 81 MG EC tablet Take 1 tablet (81 mg total) by mouth daily. Swallow whole.  30 tablet  12  . Calcium Carbonate-Vitamin D (CALCIUM-VITAMIN D) 500-200 MG-UNIT per tablet Take 1 tablet by mouth daily.       . diazepam (VALIUM) 5 MG tablet Take 0.5 tablets (2.5 mg total) by mouth 2 (two) times daily.  30 tablet  0  . feeding supplement (ENSURE) PUDG Take 1 Container by mouth 3 (three) times daily between meals.  30 Can  2  . ferrous sulfate 325 (65 FE) MG EC tablet Take 325 mg by mouth daily with breakfast.      . FLUoxetine (PROZAC) 20 MG tablet Take 20 mg by mouth daily.      Marland Kitchen HYDROcodone-acetaminophen (NORCO) 5-325 MG per tablet Take 1 tablet by mouth  every 6 (six) hours as needed for pain.       . metoprolol (LOPRESSOR) 100 MG tablet Take 100 mg by mouth daily.      . pantoprazole (PROTONIX) 40 MG tablet Take 40 mg by mouth daily.      . polyethylene glycol (MIRALAX / GLYCOLAX) packet Take 17 g by mouth daily.      . pravastatin (PRAVACHOL) 40 MG tablet Take 40 mg by mouth daily.      Marland Kitchen rOPINIRole (REQUIP) 3 MG tablet Take 3 mg by mouth at bedtime.      . topiramate (TOPAMAX) 50 MG tablet Take 1 tablet (50 mg total) by  mouth 2 (two) times daily.  60 tablet  2   No facility-administered medications prior to visit.    PHYSICAL EXAM  Filed Vitals:   08/29/13 1506  BP: 170/78  Pulse: 60  Temp: 97 F (36.1 C)  TempSrc: Oral  Height: 5\' 4"  (1.626 m)  Weight: 208 lb (94.348 kg)   Body mass index is 35.69 kg/(m^2).  Physical Exam  General: well developed, well nourished, seated, in no evident distress  Head: head normocephalic and atraumatic. Orohparynx benign  Neck: supple with no carotid or supraclavicular bruits  Cardiovascular: regular rate and rhythm, no murmurs  Musculoskeletal: no deformity  Skin: no rash/petichiae  Vascular: Normal pulses all extremities   Neurologic Exam  Mental Status: Awake and fully alert. Oriented to place and time. Recent and very slightly diminished. Recall 2/3. and remote memory intact. Attention span, concentration and fund of knowledge appropriate. Mood and affect appropriate.  Cranial Nerves:  Pupils equal, briskly reactive to light. Extraocular movements full without nystagmus. Mild restriction of vertical upgaze Visual fields dense left hemianopsia to confrontation. Hearing intact. Facial sensation intact. Face, tongue, palate moves normally and symmetrically.  Motor: Normal bulk and tone. Normal strength in all tested extremity muscles. Diminished fine finger was in the left. Orbits right over left upper extremity. His increased tone in the left leg.  Sensory: intact to touch and pinprick and vibratory sensation.  Coordination: Rapid alternating movements normal in all extremities. Finger-to-nose and heel-to-shin performed accurately bilaterally.  Gait and Station: Arises from chair with mild difficulty. Stance is normal. Uses a rolling walker demonstrates normal stride length and Mild imbalance.  Reflexes: 1+ and symmetric. Toes downgoing.   ASSESSMENT AND PLAN 54 year lady with right posterior cerebral embolic infart with hemorrhagic transformation with slight  intraventricular extension in June 2014 secondary to atrial fibrillation. Vascular risk factors of atrial fibrillation, hypertension and hyperlipidemia.  PLAN:  Continue coated 81 mg aspirin daily for stroke prevention. She has atrial fibrillation but is at high risk for bleeding given history of bleeding peptic ulcer in the past and recent intracerebral hemorrhage. Strict control of hypertension with blood pressure goal below 130/90 and lipids with LDL cholesterol goal below 100 mg percent.  Return for followup in 6 months with Jeani Hawking, NP.  Rudi Rummage Neal Oshea, MSN, NP-C 08/29/2013, 3:25 PM [facility] [address], [place], [place] [address] [phone]  Note: This document was prepared with digital dictation and possible smart phrase technology. Any transcriptional errors that result from this process are unintentional.

## 2014-02-27 ENCOUNTER — Ambulatory Visit: Payer: Medicare Other | Admitting: Nurse Practitioner

## 2014-06-25 IMAGING — CT CT HEAD W/O CM
1 of 2 series · 13 of 30 positions shown, 17 images · non-contrast
Comparison: No priors.

CLINICAL DATA: Code stroke.  Left arm weakness.  Tingling and
facial droop.

CT HEAD WITHOUT CONTRAST
TECHNIQUE: Contiguous axial images were obtained from the base of
the skull through the vertex without contrast.

[Series 2: brain · axial · 0.47mm/px · z∈[+117,+249]mm · 13 of 28 slices shown, 17 images]
[im 2/28  brain]
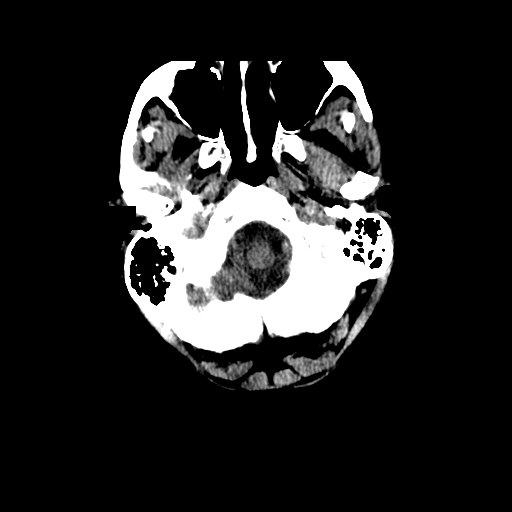
[im 2/28  bone]
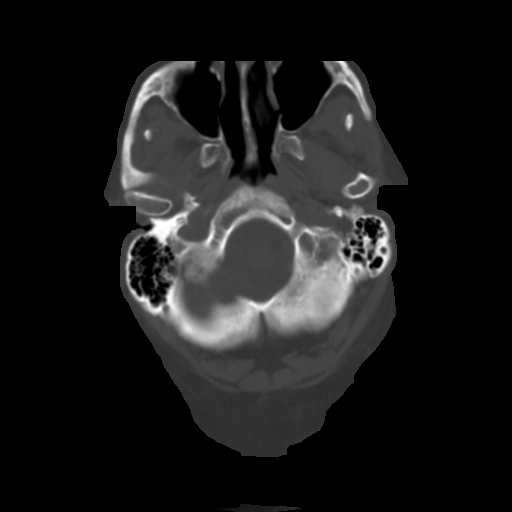
[im 4/28  brain]
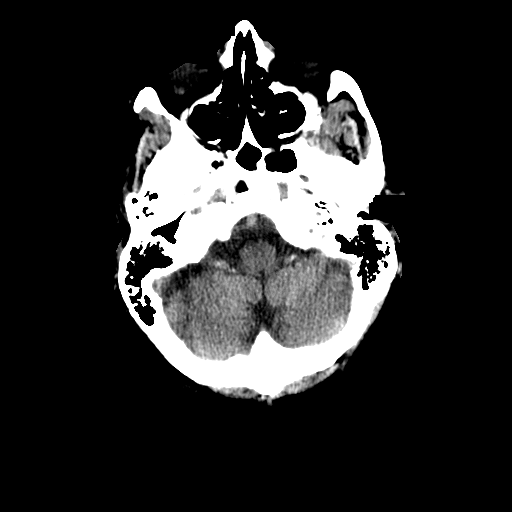
[im 6/28  brain]
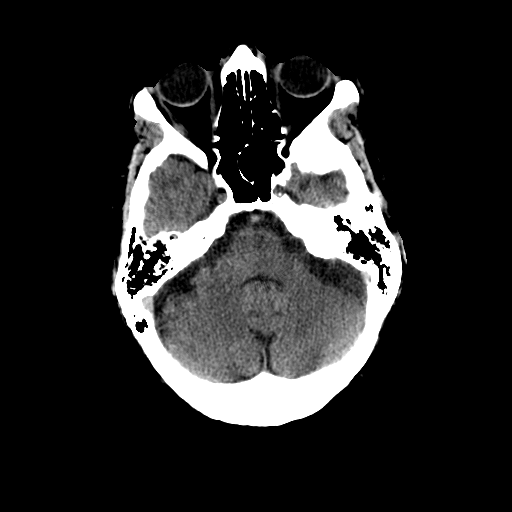
[im 8/28  brain]
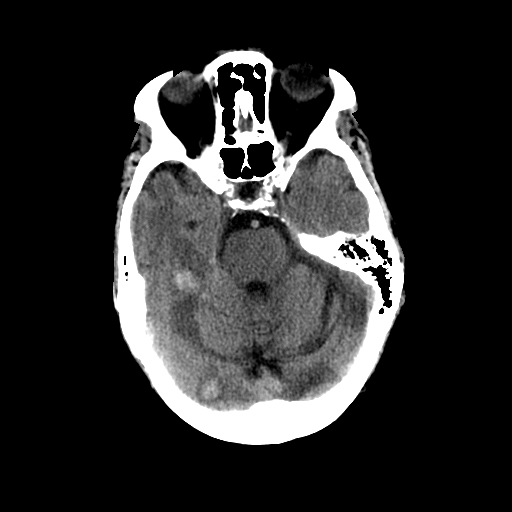
[im 10/28  brain]
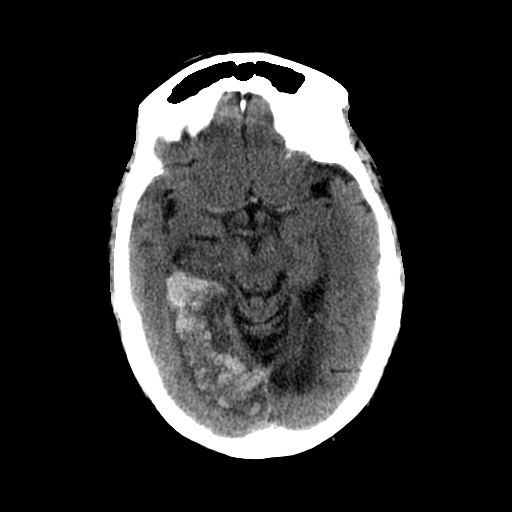
[im 10/28  bone]
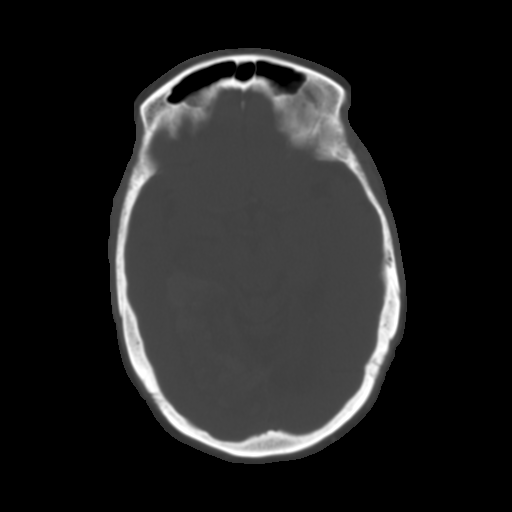
[im 12/28  brain]
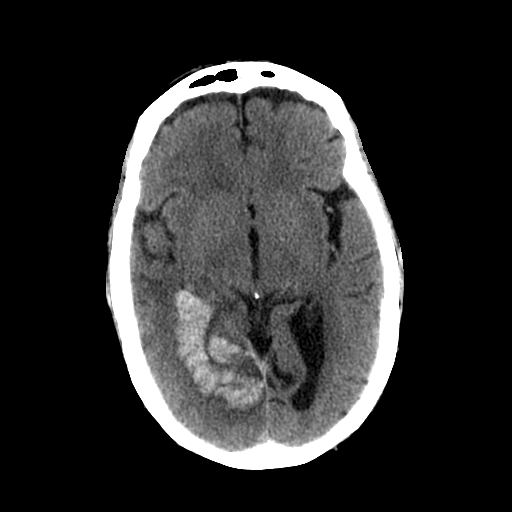
[im 14/28  brain]
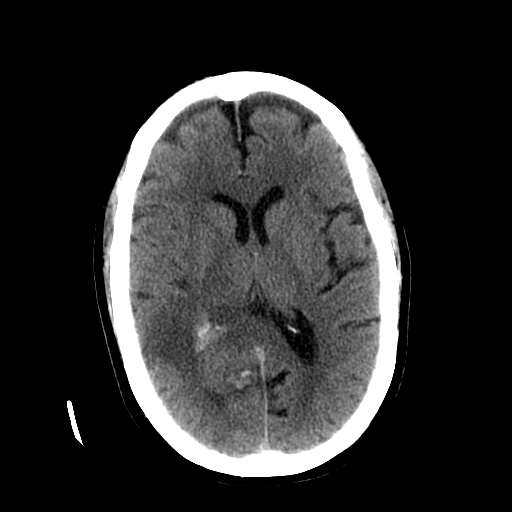
[im 16/28  brain]
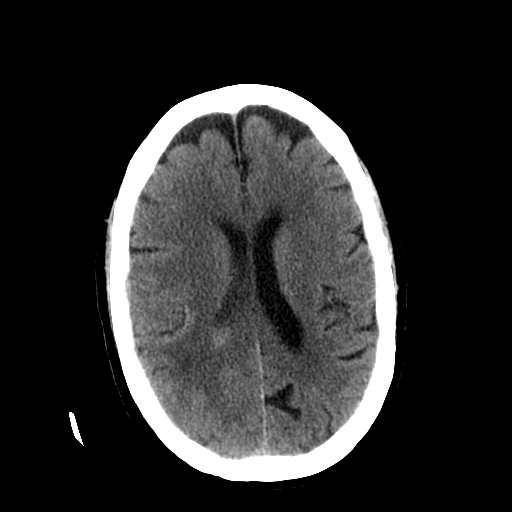
[im 18/28  brain]
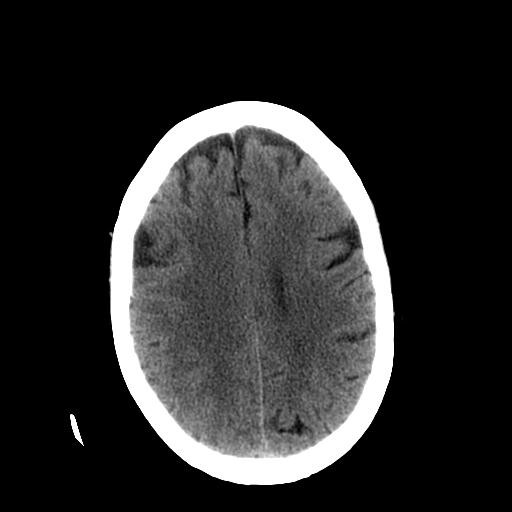
[im 18/28  bone]
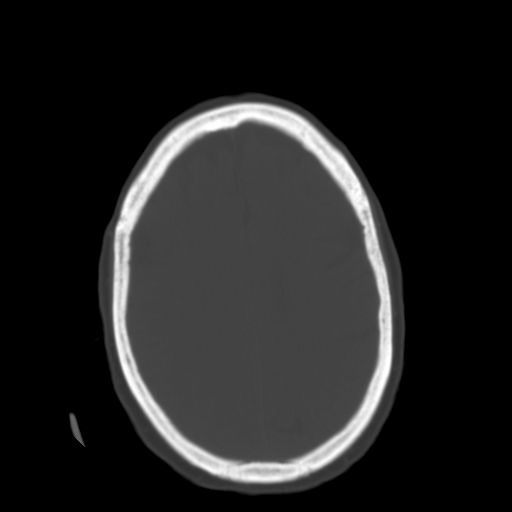
[im 20/28  brain]
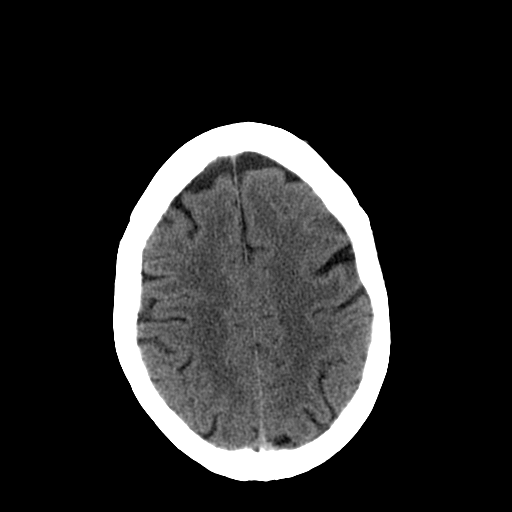
[im 22/28  brain]
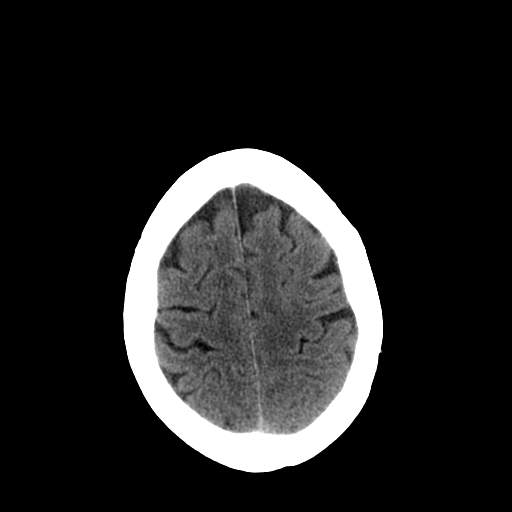
[im 24/28  brain]
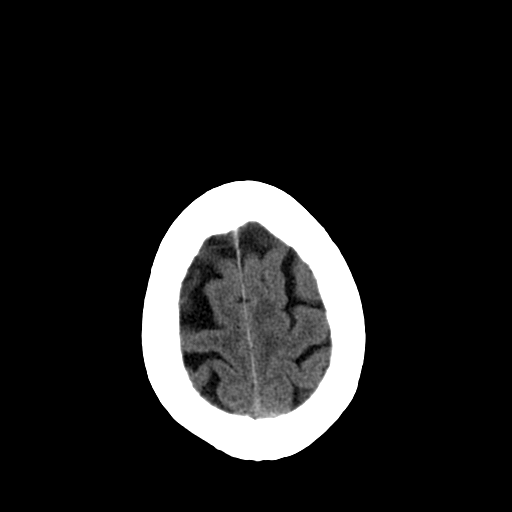
[im 26/28  brain]
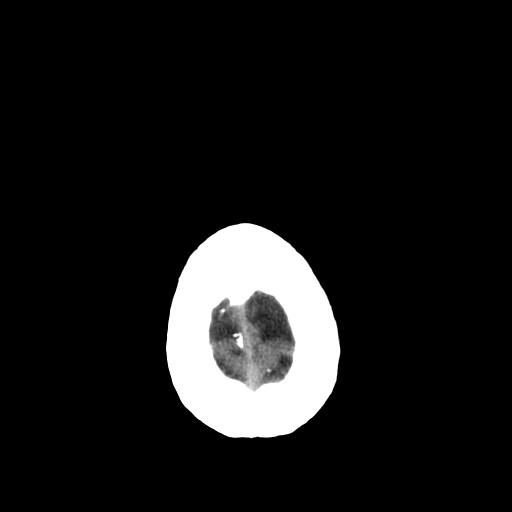
[im 26/28  bone]
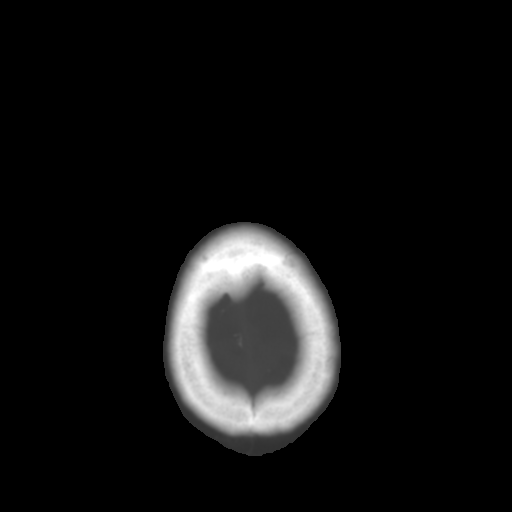

[13 of 30 positions shown; findings below may reference images not displayed]

FINDINGS: The posterior aspect of the right temporal lobe extending
into the right occipital lobe there is a large intraparenchymal
hemorrhage which measures approximately 7.7 x 2.4 cm.  This is
surrounded by some low attenuation in the adjacent parenchyma,
compatible with some surrounding vasogenic edema.  There is
intraventricular blood within the posterior aspect of the right
lateral ventricle.  No blood identified within the left lateral
ventricle, third ventricle or fourth ventricle at this time.  No
hydrocephalous.  There is some mild local mass effect, but no
evidence of frank midline shift.  Mild cerebral and cerebellar
atrophy.  Visualized paranasal sinuses and mastoids are well
pneumatized.  No acute displaced skull fractures are noted.
IMPRESSION: 1.  Large cerebral hemorrhage centered in the right posterior
temporal and occipital regions with surrounding vasogenic edema and
a small amount of local mass effect.  There is intraventricular
hemorrhage within the right lateral ventricle at this time.  No
associated hydrocephalus.
2.  Mild cerebral and cerebellar atrophy.

to Dr. Chaxaia, who verbally acknowledged these results.

## 2014-06-27 IMAGING — CT CT HEAD W/O CM
1 of 2 series · 13 of 30 positions shown, 17 images · non-contrast
Comparison: Head CT and MRI brain 02/06/2013.

CLINICAL DATA: Follow-up hemorrhagic right PCA infarct.

CT HEAD WITHOUT CONTRAST
TECHNIQUE: Contiguous axial images were obtained from the base of
the skull through the vertex without contrast.

[Series 2: brain · axial · 0.47mm/px · z∈[+106,+240]mm · 13 of 36 slices shown, 17 images]
[im 3/36  brain]
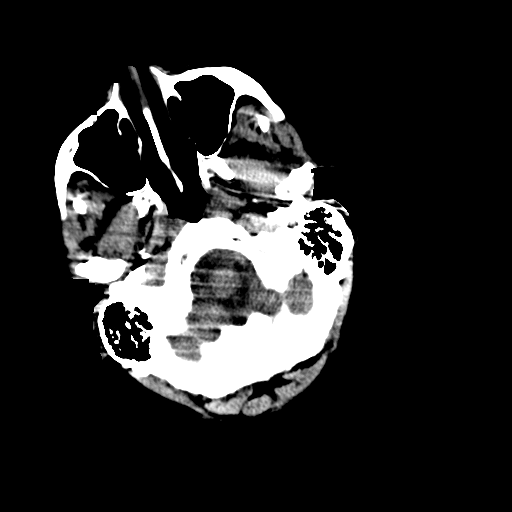
[im 3/36  bone]
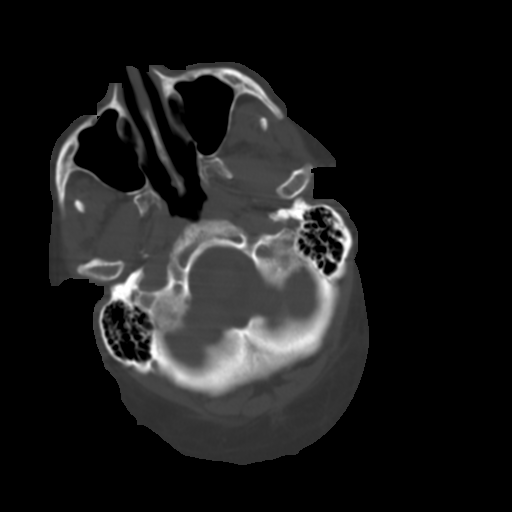
[im 6/36  brain]
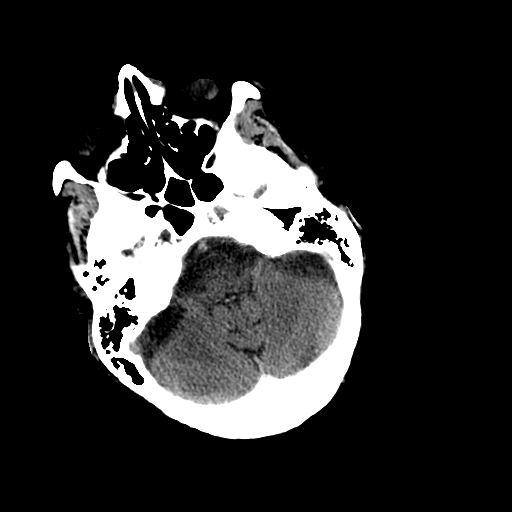
[im 8/36  brain]
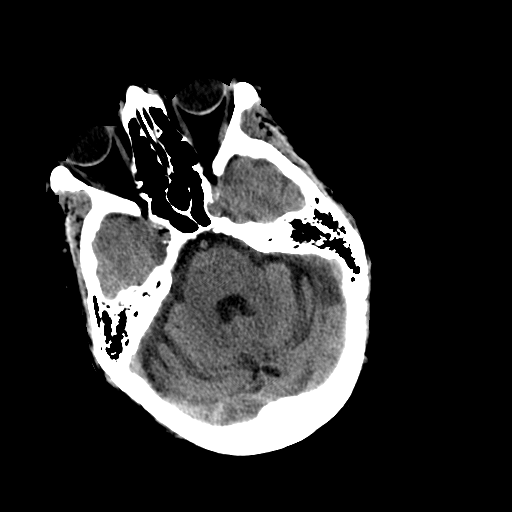
[im 11/36  brain]
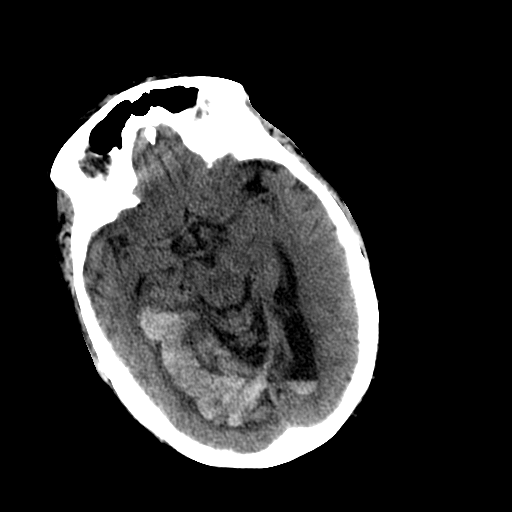
[im 13/36  brain]
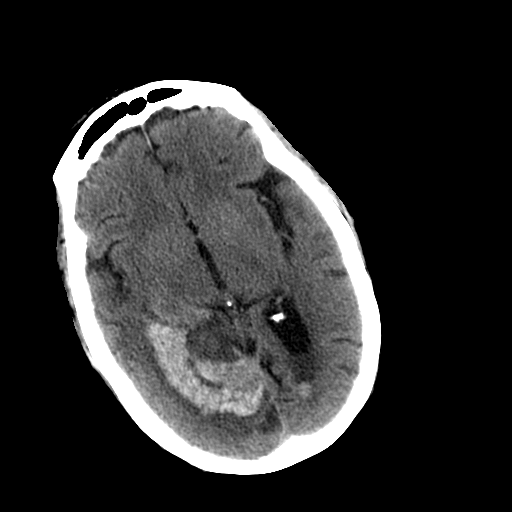
[im 13/36  bone]
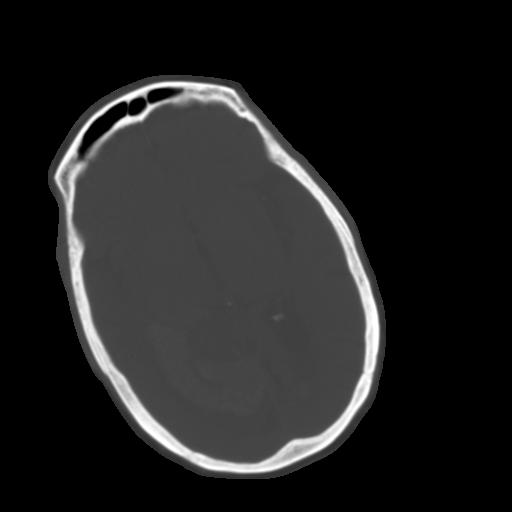
[im 16/36  brain]
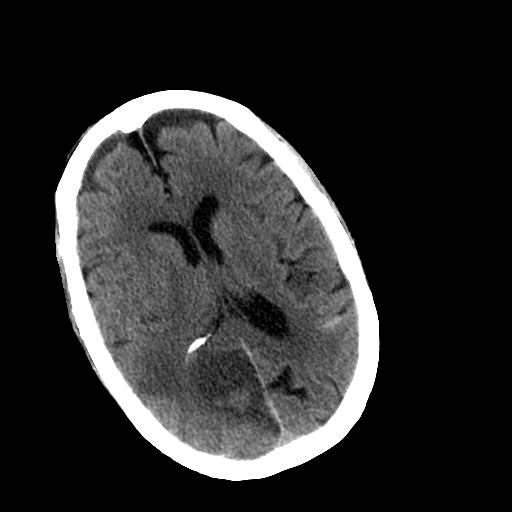
[im 18/36  brain]
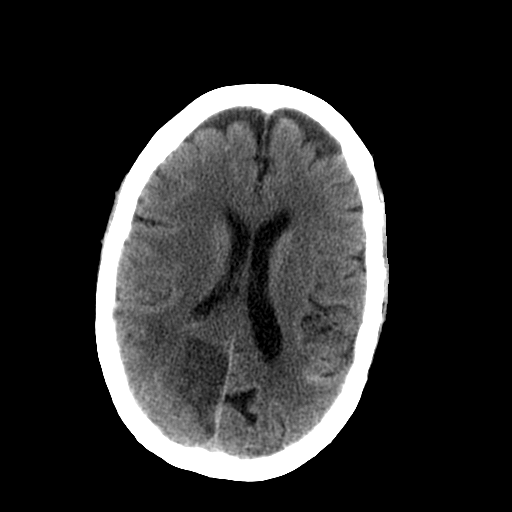
[im 21/36  brain]
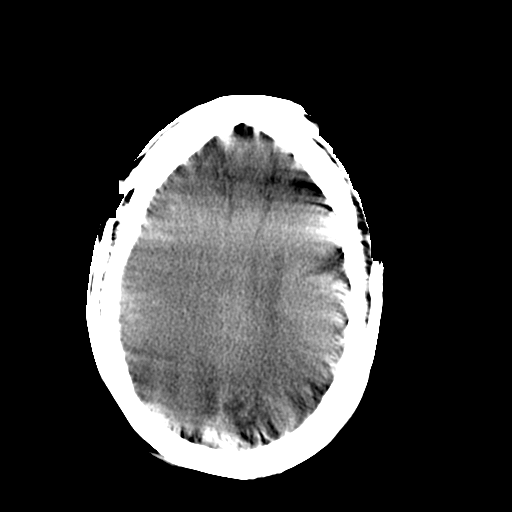
[im 23/36  brain]
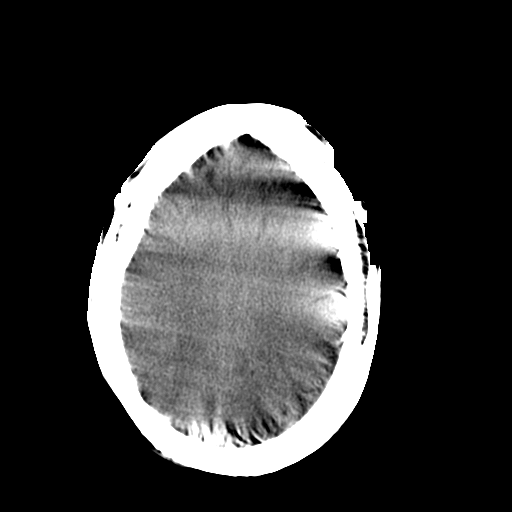
[im 23/36  bone]
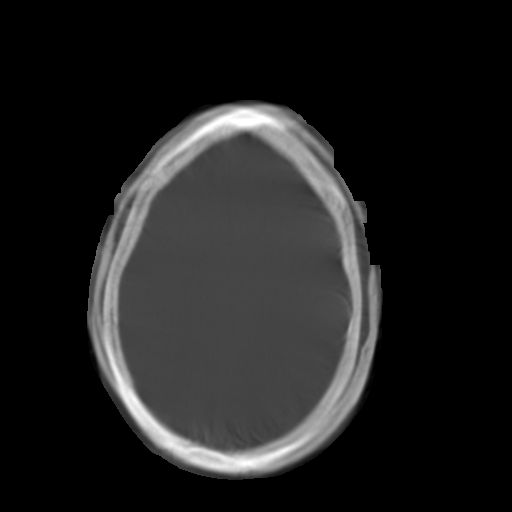
[im 26/36  brain]
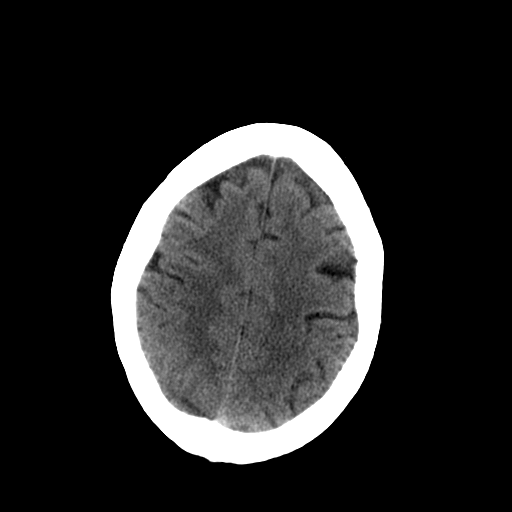
[im 28/36  brain]
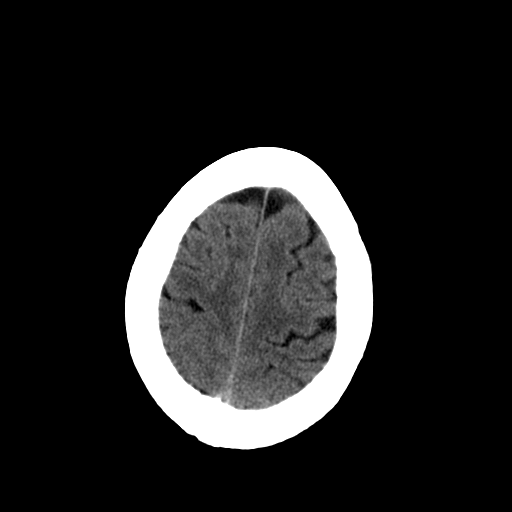
[im 31/36  brain]
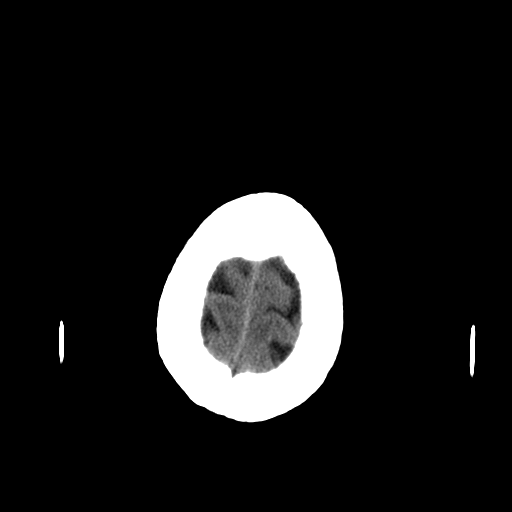
[im 33/36  brain]
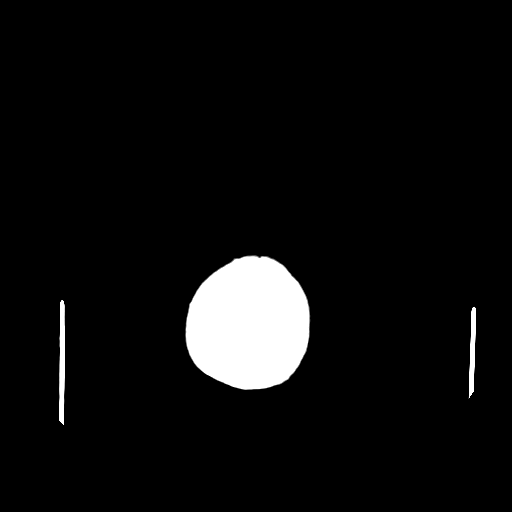
[im 33/36  bone]
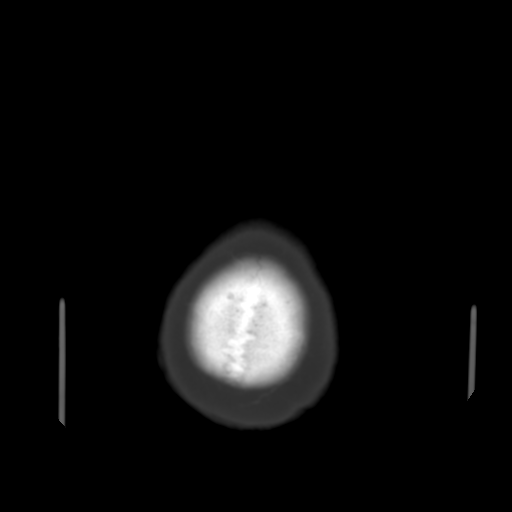

[13 of 30 positions shown; findings below may reference images not displayed]

FINDINGS: Study is mildly degraded despite repeating several
images.  The large hemorrhagic infarct within the right posterior
cerebral artery distribution has not significantly changed.  This
measures approximately 7.7 x 3.6 cm transverse.  There is
progressive low density along the superior aspect of this
hemorrhage.  There is also slightly progressive mass effect on the
occipital horn of the right lateral ventricle.  A small amount of
intraventricular hemorrhage is present within the occipital horns,
greater on the left.  This is new from the CT, although apparent on
the MRI.  No significant ventricular dilatation is identified.

Trace subarachnoid blood is noted along the left parietal
convexity.  No other new areas of intracranial hemorrhage are
identified.  There is no midline shift or evidence of new
infarction.

The visualized paranasal sinuses, mastoid air cells and middle ears
are clear.
IMPRESSION: 1.  Evolving hemorrhagic infarct in the right PCA territory as
described.  There is slightly increased local mass effect with
increased intraventricular hemorrhage.
2.  Trace subarachnoid hemorrhage along the left parietal
convexity.

## 2015-07-09 ENCOUNTER — Emergency Department (HOSPITAL_COMMUNITY): Payer: Medicare Other

## 2015-07-09 ENCOUNTER — Emergency Department (HOSPITAL_COMMUNITY)
Admission: EM | Admit: 2015-07-09 | Discharge: 2015-07-10 | Disposition: A | Discharge status: Home / Self Care | Payer: Medicare Other | Attending: Emergency Medicine | Admitting: Emergency Medicine

## 2015-07-09 ENCOUNTER — Encounter (HOSPITAL_COMMUNITY): Payer: Self-pay | Admitting: Emergency Medicine

## 2015-07-09 DIAGNOSIS — R51 Headache: Secondary | ICD-10-CM | POA: Insufficient documentation

## 2015-07-09 DIAGNOSIS — Z7982 Long term (current) use of aspirin: Secondary | ICD-10-CM | POA: Insufficient documentation

## 2015-07-09 DIAGNOSIS — I4891 Unspecified atrial fibrillation: Secondary | ICD-10-CM | POA: Diagnosis not present

## 2015-07-09 DIAGNOSIS — Z862 Personal history of diseases of the blood and blood-forming organs and certain disorders involving the immune mechanism: Secondary | ICD-10-CM | POA: Diagnosis not present

## 2015-07-09 DIAGNOSIS — R61 Generalized hyperhidrosis: Secondary | ICD-10-CM | POA: Diagnosis not present

## 2015-07-09 DIAGNOSIS — Z853 Personal history of malignant neoplasm of breast: Secondary | ICD-10-CM | POA: Insufficient documentation

## 2015-07-09 DIAGNOSIS — F419 Anxiety disorder, unspecified: Secondary | ICD-10-CM | POA: Diagnosis not present

## 2015-07-09 DIAGNOSIS — R110 Nausea: Secondary | ICD-10-CM | POA: Diagnosis not present

## 2015-07-09 DIAGNOSIS — F329 Major depressive disorder, single episode, unspecified: Secondary | ICD-10-CM | POA: Diagnosis not present

## 2015-07-09 DIAGNOSIS — G2581 Restless legs syndrome: Secondary | ICD-10-CM | POA: Insufficient documentation

## 2015-07-09 DIAGNOSIS — I2510 Atherosclerotic heart disease of native coronary artery without angina pectoris: Secondary | ICD-10-CM | POA: Insufficient documentation

## 2015-07-09 DIAGNOSIS — J441 Chronic obstructive pulmonary disease with (acute) exacerbation: Secondary | ICD-10-CM | POA: Diagnosis not present

## 2015-07-09 DIAGNOSIS — R079 Chest pain, unspecified: Secondary | ICD-10-CM | POA: Diagnosis not present

## 2015-07-09 DIAGNOSIS — I5030 Unspecified diastolic (congestive) heart failure: Secondary | ICD-10-CM | POA: Insufficient documentation

## 2015-07-09 DIAGNOSIS — M1990 Unspecified osteoarthritis, unspecified site: Secondary | ICD-10-CM | POA: Diagnosis not present

## 2015-07-09 DIAGNOSIS — Z79899 Other long term (current) drug therapy: Secondary | ICD-10-CM | POA: Diagnosis not present

## 2015-07-09 DIAGNOSIS — Z8673 Personal history of transient ischemic attack (TIA), and cerebral infarction without residual deficits: Secondary | ICD-10-CM | POA: Insufficient documentation

## 2015-07-09 DIAGNOSIS — Z8719 Personal history of other diseases of the digestive system: Secondary | ICD-10-CM | POA: Insufficient documentation

## 2015-07-09 HISTORY — DX: Peripheral vascular disease, unspecified: I73.9

## 2015-07-09 HISTORY — DX: Unspecified osteoarthritis, unspecified site: M19.90

## 2015-07-09 HISTORY — DX: Malignant (primary) neoplasm, unspecified: C80.1

## 2015-07-09 LAB — CBC WITH DIFFERENTIAL/PLATELET
BASOS ABS: 0 10*3/uL (ref 0.0–0.1)
BASOS PCT: 0 %
EOS ABS: 0.1 10*3/uL (ref 0.0–0.7)
Eosinophils Relative: 1 %
HCT: 39.3 % (ref 36.0–46.0)
Hemoglobin: 12.9 g/dL (ref 12.0–15.0)
Lymphocytes Relative: 33 %
Lymphs Abs: 2.7 10*3/uL (ref 0.7–4.0)
MCH: 29.9 pg (ref 26.0–34.0)
MCHC: 32.8 g/dL (ref 30.0–36.0)
MCV: 91.2 fL (ref 78.0–100.0)
MONOS PCT: 10 %
Monocytes Absolute: 0.8 10*3/uL (ref 0.1–1.0)
NEUTROS PCT: 56 %
Neutro Abs: 4.6 10*3/uL (ref 1.7–7.7)
PLATELETS: 120 10*3/uL — AB (ref 150–400)
RBC: 4.31 MIL/uL (ref 3.87–5.11)
RDW: 14.6 % (ref 11.5–15.5)
Smear Review: DECREASED
WBC: 8.2 10*3/uL (ref 4.0–10.5)

## 2015-07-09 LAB — TROPONIN I: Troponin I: <0.03 ng/mL (ref <0.031)

## 2015-07-09 MED ORDER — ACETAMINOPHEN 325 MG PO TABS
650.0000 mg | ORAL_TABLET | Freq: Once | ORAL | Status: AC
  Administered 2015-07-09: 650 mg via ORAL
  Filled 2015-07-09: qty 2

## 2015-07-09 MED ORDER — NITROGLYCERIN 2 % TD OINT
1.0000 [in_us] | TOPICAL_OINTMENT | Freq: Once | TRANSDERMAL | Status: AC
  Administered 2015-07-09: 1 [in_us] via TOPICAL
  Filled 2015-07-09: qty 1

## 2015-07-09 NOTE — ED Notes (Signed)
Pt denies chest pain at present, states she has a headache.

## 2015-07-09 NOTE — ED Notes (Signed)
Pt states she was having chest pain but not know. Calls her chest heavy. Says has a head ache now.

## 2015-07-09 NOTE — ED Notes (Signed)
Pt a resident at [facility]. EMS called for chest pain. Pt was given 324 ASA by NH staff. EKG en route shows Afib, pt has hx of same.

## 2015-07-09 NOTE — ED Notes (Signed)
EKG done and seen by Dr Alvino Chapel

## 2015-07-09 NOTE — ED Provider Notes (Signed)
CSN: [id number]     Arrival date & time 07/09/15  2230 History  By signing my name below, I, Meriel Pica, attest that this documentation has been prepared under the direction and in the presence of Rolland Porter, MD at 2309. Electronically Signed: Meriel Pica, ED Scribe. 07/09/2015. 11:57 PM.   Chief Complaint  Patient presents with  . Chest Pain   The history is provided by the patient and a relative. No language interpreter was used.   HPI Comments: Tracey [name] is a 79 y.o. female, with a PMhx of Afib, diastolic heart failure, HTN, CHF, CAD, GERD, breast CA, and CVA,  brought in by ambulance, who presents to the Emergency Department complaining of sudden onset, intermittent, left-sided CP onset about 7 pm  that she describes to be a current heaviness. Pt reports the CP lasts for a few minutes at a time. She associates nausea, SOB and diaphoresis that have since resolved; no vomiting. Daughter notes similar episodes of CP 3-4 months ago when the pt was admitted to [facility] but etiology of her CP was not found. EKG en route revealed pt to be in Afib which daughter reports a history of. Pt was given 324mg  Aspirin by nursing home staff around 2100 with significant relief of pain. Staff also administered Mylanta. She is not followed by a cardiologist regularly. Pt resides at [facility] s/p CVA 1 year ago with baseline left-sided weakness and dysarthria. Pt not on blood thinning medication. She is ambulatory with a walker during physical therapy but is predominantly in a wheelchair, per daughter. Daughter reports last week the pt's oncologist  took her off a medication due to recurrent benign exams and imaging; however, daughter is unsure of the name of this medication.   Pt is also c/o an anterior headache onset today. H/o brain aneurysm.   PCP: MD at nursing home, Dr Eula Fried Oncologist: Ledell Noss   Past Medical History  Diagnosis Date  . Atrial fibrillation ([place])   .  Diastolic heart failure ([place])   . Depression   . Anxiety disorder   . GERD (gastroesophageal reflux disease)   . Low back pain   . Hypertension   . RLS (restless legs syndrome)   . CVA (cerebral infarction)   . CHF (congestive heart failure) ([place])   . Anemia   . Dyspnea   . Hyponatremia   . Pleural effusion   . Coronary artery disease   . COPD (chronic obstructive pulmonary disease) ([place])   . Stroke [facility]) 2012    left sided deficits  . Bulging discs     receives epidurals for pain control  . Arthritis   . Peripheral vascular disease ([place])   . Cancer [facility])     breast   Past Surgical History  Procedure Laterality Date  . Carpal tunnel release    . Cystectomy    . Cataract extraction    . Appendectomy    . Abdominal hysterectomy     Family History  Problem Relation Age of Onset  . Cancer Mother    Social History  Substance Use Topics  . Smoking status: Never Smoker   . Smokeless tobacco: None  . Alcohol Use: No   Lives in NH  Since last stroke in Dec Uses a wheelchair, getting therapy to use walker  OB History    Gravida Para Term Preterm AB TAB SAB Ectopic Multiple Living   2 2 2  Review of Systems  Constitutional: Positive for diaphoresis. Negative for fever.  Respiratory: Positive for shortness of breath.   Cardiovascular: Positive for chest pain.  Gastrointestinal: Positive for nausea. Negative for vomiting.  Neurological: Positive for weakness ( baseline s/p CVA) and headaches.  All other systems reviewed and are negative.  Allergies  Benadryl  Home Medications   Prior to Admission medications   Medication Sig Start Date End Date Taking? Authorizing Provider  acetaminophen (TYLENOL) 325 MG tablet Take 650 mg by mouth every 4 (four) hours as needed for mild pain or moderate pain.   Yes Historical Provider, MD  alum & mag hydroxide-simeth (MAALOX/MYLANTA) 200-200-20 MG/5ML suspension Take 30 mLs by mouth daily as needed for indigestion or  heartburn (or upset stomach).   Yes Historical Provider, MD  aspirin 81 MG EC tablet Take 1 tablet (81 mg total) by mouth daily. Swallow whole. 05/25/13  Yes Garvin Fila, MD  atorvastatin (LIPITOR) 20 MG tablet Take 20 mg by mouth daily.   Yes Historical Provider, MD  calcitonin, salmon, (MIACALCIN/FORTICAL) 200 UNIT/ACT nasal spray Place 1 spray into alternate nostrils daily.   Yes Historical Provider, MD  Calcium Carbonate-Vitamin D3 (CALCIUM 600+D3) 600-400 MG-UNIT TABS Take 1 tablet by mouth 2 (two) times daily.   Yes Historical Provider, MD  carboxymethylcellulose (REFRESH PLUS) 0.5 % SOLN Place 1 drop into both eyes 4 (four) times daily.   Yes Historical Provider, MD  divalproex (DEPAKOTE) 250 MG DR tablet Take 250 mg by mouth 2 (two) times daily. FOR MOOD   Yes Historical Provider, MD  docusate sodium (COLACE) 100 MG capsule Take 100 mg by mouth 2 (two) times daily.   Yes Historical Provider, MD  furosemide (LASIX) 40 MG tablet Take 40 mg by mouth daily.   Yes Historical Provider, MD  gabapentin (NEURONTIN) 300 MG capsule Take 300 mg by mouth 3 (three) times daily.   Yes Historical Provider, MD  HYDROcodone-acetaminophen (NORCO) 5-325 MG per tablet Take 1 tablet by mouth every 8 (eight) hours.    Yes Historical Provider, MD  isosorbide mononitrate (IMDUR) 30 MG 24 hr tablet Take 30 mg by mouth daily.   Yes Historical Provider, MD  LORazepam (ATIVAN) 0.5 MG tablet Take 0.5 mg by mouth daily. *May take as needed*   Yes Historical Provider, MD  losartan (COZAAR) 100 MG tablet Take 100 mg by mouth daily.   Yes Historical Provider, MD  lubiprostone (AMITIZA) 24 MCG capsule Take 24 mcg by mouth daily.   Yes Historical Provider, MD  metoprolol tartrate (LOPRESSOR) 25 MG tablet Take 25-50 mg by mouth 2 (two) times daily. 50mg  in the morning and 25mg  in the evening   Yes Historical Provider, MD  polyethylene glycol (MIRALAX / GLYCOLAX) packet Take 17 g by mouth daily as needed for mild constipation.     Yes Historical Provider, MD  psyllium (REGULOID) 0.52 G capsule Take 0.52 g by mouth daily.   Yes Historical Provider, MD  rOPINIRole (REQUIP) 3 MG tablet Take 3 mg by mouth at bedtime.   Yes Historical Provider, MD  sennosides-docusate sodium (SENOKOT-S) 8.6-50 MG tablet Take 1 tablet by mouth daily.   Yes Historical Provider, MD  sodium phosphate (FLEET) enema Place 1 enema rectally once as needed (for constipation). follow package directions   Yes Historical Provider, MD  traZODone (DESYREL) 50 MG tablet Take 50 mg by mouth at bedtime.   Yes Historical Provider, MD  venlafaxine XR (EFFEXOR-XR) 150 MG 24 hr capsule Take 150 mg  by mouth daily with breakfast.   Yes Historical Provider, MD   BP 139/76 mmHg  Pulse 64  Temp(Src) 97.7 F (36.5 C) (Oral)  Resp 16  Ht 5\' 4"  (1.626 m)  Wt 190 lb (86.183 kg)  BMI 32.60 kg/m2  SpO2 94%  Vital signs normal   Physical Exam  Constitutional: She is oriented to person, place, and time. She appears well-developed and well-nourished.  Non-toxic appearance. She does not appear ill. No distress.  HENT:  Head: Normocephalic and atraumatic.  Right Ear: External ear normal.  Left Ear: External ear normal.  Nose: Nose normal. No mucosal edema or rhinorrhea.  Mouth/Throat: Oropharynx is clear and moist and mucous membranes are normal. No dental abscesses or uvula swelling.  Eyes: Conjunctivae and EOM are normal. Pupils are equal, round, and reactive to light.  Neck: Normal range of motion and full passive range of motion without pain. Neck supple.  Cardiovascular: Normal rate, regular rhythm and normal heart sounds.  Exam reveals no gallop and no friction rub.   No murmur heard. Pulmonary/Chest: Effort normal and breath sounds normal. No respiratory distress. She has no wheezes. She has no rhonchi. She has no rales. She exhibits no tenderness and no crepitus.  Abdominal: Soft. Normal appearance and bowel sounds are normal. She exhibits no distension.  There is no tenderness. There is no rebound and no guarding.  Musculoskeletal: Normal range of motion. She exhibits no edema or tenderness.  Moves all extremities well.   Neurological: She is alert and oriented to person, place, and time.  Dysarthria; bilateral weakness but worse on the left.   Skin: Skin is warm, dry and intact. No rash noted. No erythema. No pallor.  Psychiatric: She has a normal mood and affect. Her speech is normal and behavior is normal. Her mood appears not anxious.  Nursing note and vitals reviewed.   ED Course  Procedures   Medications  nitroGLYCERIN (NITROGLYN) 2 % ointment 1 inch (1 inch Topical Given 07/09/15 2345)  acetaminophen (TYLENOL) tablet 650 mg (650 mg Oral Given 07/09/15 2344)  sodium chloride 0.9 % bolus 1,000 mL (0 mLs Intravenous Stopped 07/10/15 0247)    DIAGNOSTIC STUDIES: Oxygen Saturation is 94% on RA, adequate by my interpretation.    COORDINATION OF CARE: 11:23 PM Discussed treatment plan with pt and daughter at bedside and all parties agreed to plan. Patient was given 1 L of normal saline in case her lab work was indicative of dehydration. Patient's daughter states she is not drinking as much fluid as she should since she had her last stroke. She was given nitroglycerin for her chest pain and given acetaminophen for nitroglycerin headache.  Patient was rechecked at 1 AM. She states her chest pain is totally gone. We discussed getting a second troponin to rule out cardiac etiology for pain and she is agreeable.  At time of discharge patient was given her test results. She continues to be pain-free. She was discharged home with a prescription for nitroglycerin sublingual to try if she gets the chest pain again. On review of patient's chart she was seen by Dr. Leonie Man, her neurologist about a year ago and at that time he documents she had a hemorrhagic stroke. She also had a prior GI bleed which is why she is not on any anticoagulation or  antiplatelet agents.    Labs Review Results for orders placed or performed during the hospital encounter of 07/09/15  CBC with Differential  Result Value Ref Range  WBC 8.2 4.0 - 10.5 K/uL   RBC 4.31 3.87 - 5.11 MIL/uL   Hemoglobin 12.9 12.0 - 15.0 g/dL   HCT 39.3 36.0 - 46.0 %   MCV 91.2 78.0 - 100.0 fL   MCH 29.9 26.0 - 34.0 pg   MCHC 32.8 30.0 - 36.0 g/dL   RDW 14.6 11.5 - 15.5 %   Platelets 120 (L) 150 - 400 K/uL   Neutrophils Relative % 56 %   Neutro Abs 4.6 1.7 - 7.7 K/uL   Lymphocytes Relative 33 %   Lymphs Abs 2.7 0.7 - 4.0 K/uL   Monocytes Relative 10 %   Monocytes Absolute 0.8 0.1 - 1.0 K/uL   Eosinophils Relative 1 %   Eosinophils Absolute 0.1 0.0 - 0.7 K/uL   Basophils Relative 0 %   Basophils Absolute 0.0 0.0 - 0.1 K/uL   RBC Morphology ROULEAUX    Smear Review PLATELETS APPEAR DECREASED   Comprehensive metabolic panel  Result Value Ref Range   Sodium 137 135 - 145 mmol/L   Potassium 3.8 3.5 - 5.1 mmol/L   Chloride 95 (L) 101 - 111 mmol/L   CO2 33 (H) 22 - 32 mmol/L   Glucose, Bld 109 (H) 65 - 99 mg/dL   BUN 32 (H) 6 - 20 mg/dL   Creatinine, Ser 1.33 (H) 0.44 - 1.00 mg/dL   Calcium 9.2 8.9 - 10.3 mg/dL   Total Protein 6.5 6.5 - 8.1 g/dL   Albumin 3.4 (L) 3.5 - 5.0 g/dL   AST 25 15 - 41 U/L   ALT 15 14 - 54 U/L   Alkaline Phosphatase 57 38 - 126 U/L   Total Bilirubin 0.2 (L) 0.3 - 1.2 mg/dL   GFR calc non Af Amer 37 (L) >60 mL/min   GFR calc Af Amer 43 (L) >60 mL/min   Anion gap 9 5 - 15  Troponin I  Result Value Ref Range   Troponin I <0.03 <0.031 ng/mL  Troponin I  Result Value Ref Range   Troponin I <0.03 <0.031 ng/mL   Laboratory interpretation all normal except renal insufficiency unclear if new which would indicate dehydration or if old since her last blood work here was 2 years ago     Imaging Review Ct Head Wo Contrast  07/09/2015  CLINICAL DATA:  Acute onset of intermittent left-sided chest pain and left-sided weakness. Initial  encounter. EXAM: CT HEAD WITHOUT CONTRAST TECHNIQUE: Contiguous axial images were obtained from the base of the skull through the vertex without intravenous contrast. COMPARISON:  CT of the head performed 11/09/2014, and MRI of the brain performed 08/02/2014 FINDINGS: There is no evidence of acute infarction, mass lesion, or intra- or extra-axial hemorrhage on CT. Prominence of the ventricles sulci reflects moderate cortical volume loss. Cerebellar atrophy is noted. Scattered periventricular and subcortical white matter change likely reflects small vessel ischemic microangiopathy. Chronic encephalomalacia is noted at the right parietal-occipital region, and at the central left frontal lobe, compatible with chronic infarcts. The brainstem and fourth ventricle are within normal limits. The basal ganglia are unremarkable in appearance. No mass effect or midline shift is seen. There is no evidence of fracture; visualized osseous structures are unremarkable in appearance. The visualized portions of the orbits are within normal limits. The paranasal sinuses and mastoid air cells are well-aerated. No significant soft tissue abnormalities are seen. IMPRESSION: 1. No acute intracranial pathology seen on CT. 2. Moderate cortical volume loss and scattered small vessel ischemic microangiopathy. 3. Chronic encephalomalacia at  the right parieto-occipital region, and at the central left frontal lobe, compatible with chronic infarcts. Electronically Signed   By: Garald Balding M.D.   On: 07/09/2015 23:46   Dg Chest Port 1 View  07/09/2015  CLINICAL DATA:  Pt c/o intermittent, left-sided CP onset 4 hours ago that she describes to be a current heaviness. Pt reports the CP lasts for a few minutes at a time. She associates nausea, SOB and diaphoresis that have since resolved. Hx A-fib.*comment was truncated* EXAM: PORTABLE CHEST - 1 VIEW 02/17/2015: 02/17/2015 FINDINGS: Mild cardiomegaly. Left retrocardiac double density probably  hiatal hernia. Lungs otherwise clear. No pneumothorax. No effusion. Visualized skeletal structures are unremarkable. Surgical clips in the right axilla. IMPRESSION: No acute cardiopulmonary disease.  Hiatal hernia. Electronically Signed   By: Lucrezia Europe M.D.   On: 07/09/2015 23:35   I have personally reviewed and evaluated these images and lab results as part of my medical decision-making.   EKG Interpretation   Date/Time:  Tuesday July 09 2015 22:43:32 EST Ventricular Rate:  64 PR Interval:    QRS Duration: 85 QT Interval:  447 QTC Calculation: 461 R Axis:   17 Text Interpretation:  Atrial fibrillation Electrode noise Nonspecific T  wave abnormality No significant change since last tracing 06 Feb 2013  Confirmed by Obediah Welles  MD-I, Levorn Oleski ([id number]) on 07/09/2015 11:00:09 PM      MDM   Final diagnoses:  Chest pain    Discharge Medication List as of 07/10/2015  2:43 AM    START taking these medications   Details  nitroGLYCERIN (NITROSTAT) 0.4 MG SL tablet Place 1 tablet (0.4 mg total) under the tongue every 5 (five) minutes as needed for chest pain., Starting 07/10/2015, Until Discontinued, Print        Plan discharge  Rolland Porter, MD, FACEP   I personally performed the services described in this documentation, which was scribed in my presence. The recorded information has been reviewed and considered.  Rolland Porter, MD, Barbette Or, MD 07/10/15 [phone]

## 2015-07-10 DIAGNOSIS — R079 Chest pain, unspecified: Secondary | ICD-10-CM | POA: Diagnosis not present

## 2015-07-10 LAB — COMPREHENSIVE METABOLIC PANEL
ALT: 15 U/L (ref 14–54)
AST: 25 U/L (ref 15–41)
Albumin: 3.4 g/dL — ABNORMAL LOW (ref 3.5–5.0)
Alkaline Phosphatase: 57 U/L (ref 38–126)
Anion gap: 9 (ref 5–15)
BUN: 32 mg/dL — AB (ref 6–20)
CHLORIDE: 95 mmol/L — AB (ref 101–111)
CO2: 33 mmol/L — ABNORMAL HIGH (ref 22–32)
Calcium: 9.2 mg/dL (ref 8.9–10.3)
Creatinine, Ser: 1.33 mg/dL — ABNORMAL HIGH (ref 0.44–1.00)
GFR calc Af Amer: 43 mL/min — ABNORMAL LOW (ref >60)
GFR, EST NON AFRICAN AMERICAN: 37 mL/min — AB (ref >60)
Glucose, Bld: 109 mg/dL — ABNORMAL HIGH (ref 65–99)
POTASSIUM: 3.8 mmol/L (ref 3.5–5.1)
Sodium: 137 mmol/L (ref 135–145)
TOTAL PROTEIN: 6.5 g/dL (ref 6.5–8.1)
Total Bilirubin: 0.2 mg/dL — ABNORMAL LOW (ref 0.3–1.2)

## 2015-07-10 LAB — TROPONIN I: Troponin I: <0.03 ng/mL (ref <0.031)

## 2015-07-10 MED ORDER — NITROGLYCERIN 0.4 MG SL SUBL
0.4000 mg | SUBLINGUAL_TABLET | SUBLINGUAL | Status: AC | PRN
Start: 2015-07-10 — End: ?

## 2015-07-10 MED ORDER — SODIUM CHLORIDE 0.9 % IV BOLUS (SEPSIS)
1000.0000 mL | Freq: Once | INTRAVENOUS | Status: AC
  Administered 2015-07-10: 1000 mL via INTRAVENOUS

## 2015-07-10 NOTE — ED Notes (Signed)
Reports called to [facility]. Pt carried back to facility by [facility].

## 2015-07-10 NOTE — Discharge Instructions (Signed)
Her tests tonight showed she is in chronic atrial fibrillation, her cardiac enzymes were negative and her head CT showed no bleeding. Her pain resolved in the ED with acetaminophen and nitroglycerin. Try the nitroglycerin tablets if her pain returns again.

## 2015-07-10 NOTE — ED Notes (Signed)
Pt placed on bedpan

## 2016-01-23 DEATH — deceased
